# Patient Record
Sex: Male | Born: 1950
Health system: Southern US, Community
[De-identification: ages and names within clinical notes are randomized; demographics above are authoritative.]

## PROBLEM LIST (undated history)

## (undated) DIAGNOSIS — I219 Acute myocardial infarction, unspecified: Secondary | ICD-10-CM

## (undated) DIAGNOSIS — I493 Ventricular premature depolarization: Secondary | ICD-10-CM

## (undated) DIAGNOSIS — I951 Orthostatic hypotension: Secondary | ICD-10-CM

## (undated) DIAGNOSIS — Z9989 Dependence on other enabling machines and devices: Secondary | ICD-10-CM

## (undated) DIAGNOSIS — K219 Gastro-esophageal reflux disease without esophagitis: Secondary | ICD-10-CM

## (undated) DIAGNOSIS — I498 Other specified cardiac arrhythmias: Secondary | ICD-10-CM

## (undated) DIAGNOSIS — C189 Malignant neoplasm of colon, unspecified: Secondary | ICD-10-CM

## (undated) DIAGNOSIS — I10 Essential (primary) hypertension: Secondary | ICD-10-CM

## (undated) DIAGNOSIS — I2510 Atherosclerotic heart disease of native coronary artery without angina pectoris: Secondary | ICD-10-CM

## (undated) DIAGNOSIS — M549 Dorsalgia, unspecified: Secondary | ICD-10-CM

## (undated) DIAGNOSIS — I428 Other cardiomyopathies: Secondary | ICD-10-CM

## (undated) DIAGNOSIS — I5042 Chronic combined systolic (congestive) and diastolic (congestive) heart failure: Secondary | ICD-10-CM

## (undated) DIAGNOSIS — E785 Hyperlipidemia, unspecified: Secondary | ICD-10-CM

## (undated) DIAGNOSIS — G4733 Obstructive sleep apnea (adult) (pediatric): Secondary | ICD-10-CM

## (undated) DIAGNOSIS — I509 Heart failure, unspecified: Secondary | ICD-10-CM

## (undated) HISTORY — DX: Other specified cardiac arrhythmias: I49.8

## (undated) HISTORY — PX: COLON SURGERY: SHX602

## (undated) HISTORY — DX: Obstructive sleep apnea (adult) (pediatric): G47.33

## (undated) HISTORY — DX: Atherosclerotic heart disease of native coronary artery without angina pectoris: I25.10

## (undated) HISTORY — DX: Dependence on other enabling machines and devices: Z99.89

---

## 2016-12-19 ENCOUNTER — Encounter (HOSPITAL_BASED_OUTPATIENT_CLINIC_OR_DEPARTMENT_OTHER): Payer: Self-pay | Admitting: Emergency Medicine

## 2016-12-19 ENCOUNTER — Inpatient Hospital Stay (HOSPITAL_BASED_OUTPATIENT_CLINIC_OR_DEPARTMENT_OTHER)
Admission: EM | Admit: 2016-12-19 | Discharge: 2016-12-22 | DRG: 280 | Disposition: A | Discharge status: Home / Self Care | Payer: Medicare HMO | Attending: Cardiology | Admitting: Cardiology

## 2016-12-19 ENCOUNTER — Emergency Department (HOSPITAL_BASED_OUTPATIENT_CLINIC_OR_DEPARTMENT_OTHER): Payer: Medicare HMO

## 2016-12-19 DIAGNOSIS — I2510 Atherosclerotic heart disease of native coronary artery without angina pectoris: Secondary | ICD-10-CM

## 2016-12-19 DIAGNOSIS — I25110 Atherosclerotic heart disease of native coronary artery with unstable angina pectoris: Secondary | ICD-10-CM | POA: Diagnosis present

## 2016-12-19 DIAGNOSIS — Z85038 Personal history of other malignant neoplasm of large intestine: Secondary | ICD-10-CM

## 2016-12-19 DIAGNOSIS — I493 Ventricular premature depolarization: Secondary | ICD-10-CM

## 2016-12-19 DIAGNOSIS — E7800 Pure hypercholesterolemia, unspecified: Secondary | ICD-10-CM

## 2016-12-19 DIAGNOSIS — K30 Functional dyspepsia: Secondary | ICD-10-CM | POA: Diagnosis present

## 2016-12-19 DIAGNOSIS — I214 Non-ST elevation (NSTEMI) myocardial infarction: Secondary | ICD-10-CM | POA: Diagnosis not present

## 2016-12-19 DIAGNOSIS — Z79899 Other long term (current) drug therapy: Secondary | ICD-10-CM

## 2016-12-19 DIAGNOSIS — I5043 Acute on chronic combined systolic (congestive) and diastolic (congestive) heart failure: Secondary | ICD-10-CM

## 2016-12-19 DIAGNOSIS — I10 Essential (primary) hypertension: Secondary | ICD-10-CM

## 2016-12-19 DIAGNOSIS — I5042 Chronic combined systolic (congestive) and diastolic (congestive) heart failure: Secondary | ICD-10-CM

## 2016-12-19 DIAGNOSIS — R1013 Epigastric pain: Secondary | ICD-10-CM | POA: Diagnosis not present

## 2016-12-19 DIAGNOSIS — K210 Gastro-esophageal reflux disease with esophagitis: Secondary | ICD-10-CM | POA: Diagnosis present

## 2016-12-19 DIAGNOSIS — I110 Hypertensive heart disease with heart failure: Secondary | ICD-10-CM | POA: Diagnosis present

## 2016-12-19 HISTORY — DX: Gastro-esophageal reflux disease without esophagitis: K21.9

## 2016-12-19 HISTORY — DX: Essential (primary) hypertension: I10

## 2016-12-19 HISTORY — DX: Dorsalgia, unspecified: M54.9

## 2016-12-19 HISTORY — DX: Malignant neoplasm of colon, unspecified: C18.9

## 2016-12-19 HISTORY — DX: Hyperlipidemia, unspecified: E78.5

## 2016-12-19 LAB — CBC WITH DIFFERENTIAL/PLATELET
BASOS ABS: 0 10*3/uL (ref 0.0–0.1)
Basophils Relative: 1 %
EOS ABS: 0.2 10*3/uL (ref 0.0–0.7)
EOS PCT: 4 %
HCT: 46.7 % (ref 39.0–52.0)
Hemoglobin: 16.5 g/dL (ref 13.0–17.0)
Lymphocytes Relative: 24 %
Lymphs Abs: 1.4 10*3/uL (ref 0.7–4.0)
MCH: 31.5 pg (ref 26.0–34.0)
MCHC: 35.3 g/dL (ref 30.0–36.0)
MCV: 89.1 fL (ref 78.0–100.0)
Monocytes Absolute: 0.5 10*3/uL (ref 0.1–1.0)
Monocytes Relative: 9 %
Neutro Abs: 3.6 10*3/uL (ref 1.7–7.7)
Neutrophils Relative %: 62 %
PLATELETS: 185 10*3/uL (ref 150–400)
RBC: 5.24 MIL/uL (ref 4.22–5.81)
RDW: 12.5 % (ref 11.5–15.5)
WBC: 5.8 10*3/uL (ref 4.0–10.5)

## 2016-12-19 LAB — COMPREHENSIVE METABOLIC PANEL
ALT: 24 U/L (ref 17–63)
AST: 24 U/L (ref 15–41)
Albumin: 4.1 g/dL (ref 3.5–5.0)
Alkaline Phosphatase: 59 U/L (ref 38–126)
Anion gap: 7 (ref 5–15)
BUN: 12 mg/dL (ref 6–20)
CHLORIDE: 109 mmol/L (ref 101–111)
CO2: 24 mmol/L (ref 22–32)
CREATININE: 1.43 mg/dL — AB (ref 0.61–1.24)
Calcium: 9.5 mg/dL (ref 8.9–10.3)
GFR calc non Af Amer: 50 mL/min — ABNORMAL LOW (ref >60)
GFR, EST AFRICAN AMERICAN: 58 mL/min — AB (ref >60)
Glucose, Bld: 132 mg/dL — ABNORMAL HIGH (ref 65–99)
Potassium: 3.6 mmol/L (ref 3.5–5.1)
Sodium: 140 mmol/L (ref 135–145)
Total Bilirubin: 0.9 mg/dL (ref 0.3–1.2)
Total Protein: 7.5 g/dL (ref 6.5–8.1)

## 2016-12-19 LAB — BRAIN NATRIURETIC PEPTIDE: B Natriuretic Peptide: 445.3 pg/mL — ABNORMAL HIGH (ref 0.0–100.0)

## 2016-12-19 LAB — LIPASE, BLOOD: Lipase: 27 U/L (ref 11–51)

## 2016-12-19 LAB — TROPONIN I: TROPONIN I: 0.26 ng/mL — AB (ref <0.03)

## 2016-12-19 MED ORDER — ASPIRIN 81 MG PO CHEW
324.0000 mg | CHEWABLE_TABLET | Freq: Once | ORAL | Status: AC
  Administered 2016-12-19: 324 mg via ORAL
  Filled 2016-12-19: qty 4

## 2016-12-19 MED ORDER — METOPROLOL TARTRATE 5 MG/5ML IV SOLN: 5.0000 mg | Freq: Once | INTRAVENOUS | Status: DC

## 2016-12-19 MED ORDER — ONDANSETRON HCL 4 MG/2ML IJ SOLN
4.0000 mg | Freq: Once | INTRAMUSCULAR | Status: AC
  Administered 2016-12-19: 4 mg via INTRAVENOUS
  Filled 2016-12-19: qty 2

## 2016-12-19 MED ORDER — NITROGLYCERIN 0.4 MG SL SUBL
0.4000 mg | SUBLINGUAL_TABLET | SUBLINGUAL | Status: AC | PRN
  Administered 2016-12-20 (×3): 0.4 mg via SUBLINGUAL
  Filled 2016-12-19: qty 1

## 2016-12-19 MED ORDER — FENTANYL CITRATE (PF) 100 MCG/2ML IJ SOLN
50.0000 ug | Freq: Once | INTRAMUSCULAR | Status: AC
  Administered 2016-12-19: 50 ug via INTRAVENOUS
  Filled 2016-12-19: qty 2

## 2016-12-19 MED ORDER — PANTOPRAZOLE SODIUM 40 MG IV SOLR
40.0000 mg | Freq: Once | INTRAVENOUS | Status: AC
  Administered 2016-12-19: 40 mg via INTRAVENOUS
  Filled 2016-12-19: qty 40

## 2016-12-19 NOTE — ED Notes (Signed)
EDP Dr. Florina Ou into room, prior to RN assessment, see MD notes, orders received and initiated.

## 2016-12-19 NOTE — ED Provider Notes (Addendum)
[place] DEPT [facility] Provider Note: Georgena Spurling, MD, FACEP  CSN: [id number] MRN: [id number] ARRIVAL: 12/19/16 at 2227 ROOM: [place]  Abdominal Pain   HISTORY OF PRESENT ILLNESS  David Underwood is a 66 y.o. male with a remote history of colon cancer status post colostomy. He is here with a one to two-week history of epigastric pain. The pain worsened this evening and was associated with nausea, vomiting and clamminess. He rates his pain as a 9 out of 10 at the present time. It is worse with palpation of the epigastrium. He is also had shortness of breath over the past 2 weeks. The shortness of breath is worse with exertion or with lying supine. It is improved with rest or with standing. He has a history of esophagitis and is on Protonix.    Past Medical History:  Diagnosis Date  . Back pain   . Cancer ([place])   . GERD (gastroesophageal reflux disease)   . History of colostomy   . Hypertension     Past Surgical History:  Procedure Laterality Date  . COLON SURGERY      No family history on file.  Social History  Substance Use Topics  . Smoking status: Not on file  . Smokeless tobacco: Not on file  . Alcohol use Not on file    Prior to Admission medications   Medication Sig Start Date End Date Taking? Authorizing Provider  atorvastatin (LIPITOR) 10 MG tablet Take by mouth daily.   Yes [provider]  carvedilol (COREG) 12.5 MG tablet Take 12.5 mg by mouth 2 (two) times daily with a meal.   Yes [provider]  pantoprazole (PROTONIX) 20 MG tablet Take by mouth daily.   Yes [provider]    Allergies Aspirin   REVIEW OF SYSTEMS  Negative except as noted here or in the History of Present Illness.   PHYSICAL EXAMINATION  Initial Vital Signs Blood pressure (!) 180/130, pulse 64, temperature 98.9 F (37.2 C), temperature source Oral, resp. rate 14, SpO2 99 %.  Examination General: Well-developed,  well-nourished male in no acute distress; appearance consistent with age of record HENT: normocephalic; atraumatic Eyes: pupils equal, round and reactive to light; extraocular muscles intact Neck: supple Heart: regular rate and rhythm; no murmur; frequent PVCs Lungs: clear to auscultation bilaterally Chest: Lower sternal tenderness Abdomen: soft; nondistended; epigastric tenderness; no masses or hepatosplenomegaly; bowel sounds present; right lower quadrant colostomy Extremities: No deformity; full range of motion; pulses normal; +1 edema of the lower legs Neurologic: Awake, alert and oriented; motor function intact in all extremities and symmetric; no facial droop Skin: Warm and dry Psychiatric: Normal mood and affect   RESULTS  Summary of this visit's results, reviewed by myself:   EKG Interpretation  Date/Time:  Sunday Dec 19 2016 22:40:27 EDT Ventricular Rate:  66 PR Interval:  184 QRS Duration: 114 QT Interval:  432 QTC Calculation: 452 R Axis:   -177 Text Interpretation:  Sinus rhythm with occasional Premature ventricular complexes Possible Left atrial enlargement Septal infarct , age undetermined Lateral infarct , age undetermined Abnormal ECG No previous ECGs available Confirmed by Shanon Rosser [id number]) on 12/19/2016 10:43:43 PM       EKG Interpretation  Date/Time:  Monday Dec 20 2016 01:07:17 EDT Ventricular Rate:  52 PR Interval:  184 QRS Duration: 126 QT Interval:  518 QTC Calculation: 482 R Axis:   111 Text Interpretation:  Sinus rhythm Probable left atrial enlargement Nonspecific intraventricular  conduction delay Probable anterior infarct, age indeterminate Lateral leads are also involved PVCs seen previously Confirmed by Kearstin Learn [phone]) on 12/20/2016 1:24:27 AM       Laboratory Studies: Results for orders placed or performed during the hospital encounter of 12/19/16 (from the past 24 hour(s))  CBC with Differential/Platelet     Status: None   Collection  Time: 12/19/16 10:58 PM  Result Value Ref Range   WBC 5.8 4.0 - 10.5 K/uL   RBC 5.24 4.22 - 5.81 MIL/uL   Hemoglobin 16.5 13.0 - 17.0 g/dL   HCT 46.7 39.0 - 52.0 %   MCV 89.1 78.0 - 100.0 fL   MCH 31.5 26.0 - 34.0 pg   MCHC 35.3 30.0 - 36.0 g/dL   RDW 12.5 11.5 - 15.5 %   Platelets 185 150 - 400 K/uL   Neutrophils Relative % 62 %   Neutro Abs 3.6 1.7 - 7.7 K/uL   Lymphocytes Relative 24 %   Lymphs Abs 1.4 0.7 - 4.0 K/uL   Monocytes Relative 9 %   Monocytes Absolute 0.5 0.1 - 1.0 K/uL   Eosinophils Relative 4 %   Eosinophils Absolute 0.2 0.0 - 0.7 K/uL   Basophils Relative 1 %   Basophils Absolute 0.0 0.0 - 0.1 K/uL  Comprehensive metabolic panel     Status: Abnormal   Collection Time: 12/19/16 10:58 PM  Result Value Ref Range   Sodium 140 135 - 145 mmol/L   Potassium 3.6 3.5 - 5.1 mmol/L   Chloride 109 101 - 111 mmol/L   CO2 24 22 - 32 mmol/L   Glucose, Bld 132 (H) 65 - 99 mg/dL   BUN 12 6 - 20 mg/dL   Creatinine, Ser 1.43 (H) 0.61 - 1.24 mg/dL   Calcium 9.5 8.9 - 10.3 mg/dL   Total Protein 7.5 6.5 - 8.1 g/dL   Albumin 4.1 3.5 - 5.0 g/dL   AST 24 15 - 41 U/L   ALT 24 17 - 63 U/L   Alkaline Phosphatase 59 38 - 126 U/L   Total Bilirubin 0.9 0.3 - 1.2 mg/dL   GFR calc non Af Amer 50 (L) >60 mL/min   GFR calc Af Amer 58 (L) >60 mL/min   Anion gap 7 5 - 15  Lipase, blood     Status: None   Collection Time: 12/19/16 10:58 PM  Result Value Ref Range   Lipase 27 11 - 51 U/L  Troponin I     Status: Abnormal   Collection Time: 12/19/16 10:58 PM  Result Value Ref Range   Troponin I 0.26 (HH) <0.03 ng/mL  Brain natriuretic peptide     Status: Abnormal   Collection Time: 12/19/16 10:58 PM  Result Value Ref Range   B Natriuretic Peptide 445.3 (H) 0.0 - 100.0 pg/mL   Imaging Studies: Dg Chest 2 View  Result Date: 12/19/2016 CLINICAL DATA:  Acute onset of epigastric abdominal pain and pressure. Indigestion. Initial encounter. EXAM: CHEST  2 VIEW COMPARISON:  None. FINDINGS:  The lungs are well-aerated. Pulmonary vascularity is at the upper limits of normal. There is no evidence of focal opacification, pleural effusion or pneumothorax. The heart is borderline normal in size. No acute osseous abnormalities are seen. IMPRESSION: No acute cardiopulmonary process seen. Electronically Signed   By: Garald Balding M.D.   On: 12/19/2016 23:18    ED COURSE  Nursing notes and initial vitals signs, including pulse oximetry, reviewed.  Vitals:   12/20/16 0030 12/20/16 0036 12/20/16 0045  12/20/16 0100  BP: (!) 172/101 (!) 170/108 (!) 143/107 (!) 156/98  Pulse: 61 (!) 45 (!) 59 (!) 59  Resp: 13 14 12 18   Temp:      TempSrc:      SpO2: 93% 95% 95% 93%   12:46 AM Patient rates his pain a 4 out of 10 after 50 micrograms of fentanyl IV. He has had no significant change with sublingual nitroglycerin 3.  1:30 AM Dr. Fransico Him accepts for transfer to [facility] cardiology. She requests we start heparin.  PROCEDURES    ED DIAGNOSES     ICD-9-CM ICD-10-CM   1. Non-STEMI (non-ST elevated myocardial infarction) ([place]) 410.70 I21.4   2. Epigastric pain 789.06 R10.13   3. Hypertension not at goal 401.9 I10   4. PVCs (premature ventricular contractions) 427.69 I49.3        Zahli Vetsch, MD 12/20/16 0131    Shanon Rosser, MD 12/20/16 0133    Shanon Rosser, MD 12/20/16 4097

## 2016-12-19 NOTE — ED Notes (Signed)
PT to room 9 via wheelchair. Pt now states that his epigastric pain is stabbing and pressure. 2 RN's at bedside . Pt connected to cardiac monitor, in gown on stretcher.

## 2016-12-19 NOTE — ED Triage Notes (Signed)
PT presents to ED with complaints of epigastric pain and pressure and indigestion for about a week. Pt clammy in triage.

## 2016-12-19 NOTE — ED Notes (Signed)
Alert, NAD, calm, interactive, resps e/u, speaking in clear complete sentences, no dyspnea noted, skin W&D, VSS, BP elevated, (denies: sob, nausea, dizziness or visual changes). Family at [facility].

## 2016-12-19 NOTE — ED Notes (Signed)
Troponin 0.26 per lab Dr. Florina Ou and primary RN aware.

## 2016-12-20 ENCOUNTER — Inpatient Hospital Stay (HOSPITAL_COMMUNITY): Payer: Medicare HMO

## 2016-12-20 ENCOUNTER — Encounter (HOSPITAL_COMMUNITY): Payer: Self-pay | Admitting: *Deleted

## 2016-12-20 DIAGNOSIS — I2510 Atherosclerotic heart disease of native coronary artery without angina pectoris: Secondary | ICD-10-CM | POA: Diagnosis not present

## 2016-12-20 DIAGNOSIS — I10 Essential (primary) hypertension: Secondary | ICD-10-CM

## 2016-12-20 DIAGNOSIS — I110 Hypertensive heart disease with heart failure: Secondary | ICD-10-CM | POA: Diagnosis present

## 2016-12-20 DIAGNOSIS — K30 Functional dyspepsia: Secondary | ICD-10-CM | POA: Diagnosis present

## 2016-12-20 DIAGNOSIS — R1013 Epigastric pain: Secondary | ICD-10-CM | POA: Diagnosis present

## 2016-12-20 DIAGNOSIS — I5043 Acute on chronic combined systolic (congestive) and diastolic (congestive) heart failure: Secondary | ICD-10-CM | POA: Diagnosis present

## 2016-12-20 DIAGNOSIS — E7800 Pure hypercholesterolemia, unspecified: Secondary | ICD-10-CM

## 2016-12-20 DIAGNOSIS — I214 Non-ST elevation (NSTEMI) myocardial infarction: Secondary | ICD-10-CM | POA: Diagnosis present

## 2016-12-20 DIAGNOSIS — I360 Nonrheumatic tricuspid (valve) stenosis: Secondary | ICD-10-CM

## 2016-12-20 DIAGNOSIS — I340 Nonrheumatic mitral (valve) insufficiency: Secondary | ICD-10-CM

## 2016-12-20 DIAGNOSIS — K210 Gastro-esophageal reflux disease with esophagitis: Secondary | ICD-10-CM | POA: Diagnosis present

## 2016-12-20 DIAGNOSIS — I25110 Atherosclerotic heart disease of native coronary artery with unstable angina pectoris: Secondary | ICD-10-CM | POA: Diagnosis present

## 2016-12-20 DIAGNOSIS — Z85038 Personal history of other malignant neoplasm of large intestine: Secondary | ICD-10-CM | POA: Diagnosis not present

## 2016-12-20 DIAGNOSIS — Z79899 Other long term (current) drug therapy: Secondary | ICD-10-CM | POA: Diagnosis not present

## 2016-12-20 DIAGNOSIS — I5023 Acute on chronic systolic (congestive) heart failure: Secondary | ICD-10-CM | POA: Diagnosis not present

## 2016-12-20 LAB — ECHOCARDIOGRAM COMPLETE
Height: 71 in
WEIGHTICAEL: 3456 [oz_av]

## 2016-12-20 LAB — LIPID PANEL
CHOLESTEROL: 168 mg/dL (ref 0–200)
HDL: 48 mg/dL (ref >40)
LDL Cholesterol: 109 mg/dL — ABNORMAL HIGH (ref 0–99)
Total CHOL/HDL Ratio: 3.5 RATIO
Triglycerides: 55 mg/dL (ref <150)
VLDL: 11 mg/dL (ref 0–40)

## 2016-12-20 LAB — COMPREHENSIVE METABOLIC PANEL
ALK PHOS: 50 U/L (ref 38–126)
ALT: 20 U/L (ref 17–63)
ANION GAP: 7 (ref 5–15)
AST: 20 U/L (ref 15–41)
Albumin: 3.4 g/dL — ABNORMAL LOW (ref 3.5–5.0)
BILIRUBIN TOTAL: 1.1 mg/dL (ref 0.3–1.2)
BUN: 10 mg/dL (ref 6–20)
CALCIUM: 9 mg/dL (ref 8.9–10.3)
CO2: 24 mmol/L (ref 22–32)
Chloride: 107 mmol/L (ref 101–111)
Creatinine, Ser: 1.39 mg/dL — ABNORMAL HIGH (ref 0.61–1.24)
GFR, EST AFRICAN AMERICAN: 60 mL/min — AB (ref >60)
GFR, EST NON AFRICAN AMERICAN: 52 mL/min — AB (ref >60)
Glucose, Bld: 116 mg/dL — ABNORMAL HIGH (ref 65–99)
Potassium: 3.6 mmol/L (ref 3.5–5.1)
Sodium: 138 mmol/L (ref 135–145)
TOTAL PROTEIN: 6.2 g/dL — AB (ref 6.5–8.1)

## 2016-12-20 LAB — CBC WITH DIFFERENTIAL/PLATELET
Basophils Absolute: 0 10*3/uL (ref 0.0–0.1)
Basophils Relative: 0 %
EOS ABS: 0.2 10*3/uL (ref 0.0–0.7)
Eosinophils Relative: 3 %
HCT: 41.7 % (ref 39.0–52.0)
HEMOGLOBIN: 14 g/dL (ref 13.0–17.0)
LYMPHS ABS: 1.9 10*3/uL (ref 0.7–4.0)
Lymphocytes Relative: 28 %
MCH: 30.5 pg (ref 26.0–34.0)
MCHC: 33.6 g/dL (ref 30.0–36.0)
MCV: 90.8 fL (ref 78.0–100.0)
MONOS PCT: 8 %
Monocytes Absolute: 0.5 10*3/uL (ref 0.1–1.0)
NEUTROS PCT: 61 %
Neutro Abs: 4.2 10*3/uL (ref 1.7–7.7)
Platelets: 178 10*3/uL (ref 150–400)
RBC: 4.59 MIL/uL (ref 4.22–5.81)
RDW: 12.9 % (ref 11.5–15.5)
WBC: 6.8 10*3/uL (ref 4.0–10.5)

## 2016-12-20 LAB — HEPARIN LEVEL (UNFRACTIONATED)
HEPARIN UNFRACTIONATED: 0.47 [IU]/mL (ref 0.30–0.70)
Heparin Unfractionated: 0.43 IU/mL (ref 0.30–0.70)

## 2016-12-20 LAB — PROTIME-INR
INR: 1.28
Prothrombin Time: 16.1 seconds — ABNORMAL HIGH (ref 11.4–15.2)

## 2016-12-20 LAB — TSH: TSH: 1.197 u[IU]/mL (ref 0.350–4.500)

## 2016-12-20 LAB — MAGNESIUM: MAGNESIUM: 2 mg/dL (ref 1.7–2.4)

## 2016-12-20 LAB — TROPONIN I
TROPONIN I: 0.26 ng/mL — AB (ref <0.03)
Troponin I: 0.27 ng/mL (ref <0.03)
Troponin I: 0.3 ng/mL (ref <0.03)
Troponin I: 0.37 ng/mL (ref <0.03)

## 2016-12-20 MED ORDER — ONDANSETRON HCL 4 MG/2ML IJ SOLN
4.0000 mg | Freq: Four times a day (QID) | INTRAMUSCULAR | Status: DC | PRN
  Administered 2016-12-20: 4 mg via INTRAVENOUS
  Filled 2016-12-20: qty 2

## 2016-12-20 MED ORDER — SACUBITRIL-VALSARTAN 24-26 MG PO TABS
1.0000 | ORAL_TABLET | Freq: Two times a day (BID) | ORAL | Status: DC
  Administered 2016-12-20 – 2016-12-22 (×5): 1 via ORAL
  Filled 2016-12-20 (×5): qty 1

## 2016-12-20 MED ORDER — AMLODIPINE BESYLATE 5 MG PO TABS
5.0000 mg | ORAL_TABLET | Freq: Every day | ORAL | Status: DC
  Administered 2016-12-20: 5 mg via ORAL
  Filled 2016-12-20: qty 1

## 2016-12-20 MED ORDER — FUROSEMIDE 10 MG/ML IJ SOLN
40.0000 mg | Freq: Once | INTRAMUSCULAR | Status: AC
  Administered 2016-12-20: 40 mg via INTRAVENOUS
  Filled 2016-12-20: qty 4

## 2016-12-20 MED ORDER — NITROGLYCERIN IN D5W 200-5 MCG/ML-% IV SOLN
INTRAVENOUS | Status: AC
  Filled 2016-12-20: qty 250

## 2016-12-20 MED ORDER — PANTOPRAZOLE SODIUM 40 MG PO TBEC
40.0000 mg | DELAYED_RELEASE_TABLET | Freq: Every day | ORAL | Status: DC
  Administered 2016-12-20 – 2016-12-22 (×3): 40 mg via ORAL
  Filled 2016-12-20 (×3): qty 1

## 2016-12-20 MED ORDER — NITROGLYCERIN IN D5W 200-5 MCG/ML-% IV SOLN
0.0000 ug/min | INTRAVENOUS | Status: DC
  Administered 2016-12-20: 5 ug/min via INTRAVENOUS

## 2016-12-20 MED ORDER — ASPIRIN EC 81 MG PO TBEC
81.0000 mg | DELAYED_RELEASE_TABLET | Freq: Every day | ORAL | Status: DC
  Administered 2016-12-22: 81 mg via ORAL
  Filled 2016-12-20: qty 1

## 2016-12-20 MED ORDER — POTASSIUM CHLORIDE CRYS ER 20 MEQ PO TBCR
20.0000 meq | EXTENDED_RELEASE_TABLET | Freq: Every day | ORAL | Status: DC
  Administered 2016-12-20 – 2016-12-22 (×3): 20 meq via ORAL
  Filled 2016-12-20 (×3): qty 1

## 2016-12-20 MED ORDER — ROSUVASTATIN CALCIUM 10 MG PO TABS
20.0000 mg | ORAL_TABLET | Freq: Every day | ORAL | Status: DC
  Administered 2016-12-20 – 2016-12-21 (×2): 20 mg via ORAL
  Filled 2016-12-20 (×2): qty 2

## 2016-12-20 MED ORDER — CARVEDILOL 12.5 MG PO TABS
12.5000 mg | ORAL_TABLET | Freq: Two times a day (BID) | ORAL | Status: DC
  Administered 2016-12-20: 12.5 mg via ORAL
  Filled 2016-12-20: qty 1

## 2016-12-20 MED ORDER — ALUM & MAG HYDROXIDE-SIMETH 200-200-20 MG/5ML PO SUSP
30.0000 mL | Freq: Four times a day (QID) | ORAL | Status: DC | PRN
  Administered 2016-12-20: 30 mL via ORAL
  Filled 2016-12-20: qty 30

## 2016-12-20 MED ORDER — ACETAMINOPHEN 325 MG PO TABS: 650.0000 mg | ORAL_TABLET | ORAL | Status: DC | PRN

## 2016-12-20 MED ORDER — ASPIRIN 300 MG RE SUPP: 300.0000 mg | RECTAL | Status: DC

## 2016-12-20 MED ORDER — ASPIRIN 81 MG PO CHEW
81.0000 mg | CHEWABLE_TABLET | ORAL | Status: AC
  Administered 2016-12-21: 81 mg via ORAL
  Filled 2016-12-20: qty 1

## 2016-12-20 MED ORDER — SODIUM CHLORIDE 0.9 % IV SOLN
INTRAVENOUS | Status: DC
  Administered 2016-12-21: 06:00:00 via INTRAVENOUS

## 2016-12-20 MED ORDER — NITROGLYCERIN 2 % TD OINT
1.0000 [in_us] | TOPICAL_OINTMENT | Freq: Once | TRANSDERMAL | Status: AC
  Administered 2016-12-20: 1 [in_us] via TOPICAL
  Filled 2016-12-20: qty 1

## 2016-12-20 MED ORDER — HEPARIN BOLUS VIA INFUSION
4000.0000 [IU] | Freq: Once | INTRAVENOUS | Status: AC
  Administered 2016-12-20: 4000 [IU] via INTRAVENOUS

## 2016-12-20 MED ORDER — SIMETHICONE 80 MG PO CHEW
80.0000 mg | CHEWABLE_TABLET | Freq: Every day | ORAL | Status: DC | PRN
  Filled 2016-12-20: qty 1

## 2016-12-20 MED ORDER — CARVEDILOL 6.25 MG PO TABS
6.2500 mg | ORAL_TABLET | Freq: Two times a day (BID) | ORAL | Status: DC
  Administered 2016-12-20 – 2016-12-22 (×4): 6.25 mg via ORAL
  Filled 2016-12-20 (×4): qty 1

## 2016-12-20 MED ORDER — FUROSEMIDE 40 MG PO TABS
40.0000 mg | ORAL_TABLET | Freq: Every day | ORAL | Status: DC
  Administered 2016-12-21: 40 mg via ORAL
  Filled 2016-12-20: qty 1

## 2016-12-20 MED ORDER — ACETAMINOPHEN 325 MG PO TABS
650.0000 mg | ORAL_TABLET | Freq: Once | ORAL | Status: AC
  Administered 2016-12-20: 650 mg via ORAL
  Filled 2016-12-20: qty 2

## 2016-12-20 MED ORDER — NITROGLYCERIN 0.4 MG SL SUBL
0.4000 mg | SUBLINGUAL_TABLET | SUBLINGUAL | Status: DC | PRN
  Filled 2016-12-20: qty 1

## 2016-12-20 MED ORDER — ATORVASTATIN CALCIUM 80 MG PO TABS: 80.0000 mg | ORAL_TABLET | Freq: Every day | ORAL | Status: DC

## 2016-12-20 MED ORDER — ASPIRIN 81 MG PO CHEW: 324.0000 mg | CHEWABLE_TABLET | ORAL | Status: DC

## 2016-12-20 MED ORDER — HEPARIN (PORCINE) IN NACL 100-0.45 UNIT/ML-% IJ SOLN
1000.0000 [IU]/h | INTRAMUSCULAR | Status: DC
  Administered 2016-12-20 (×2): 1000 [IU]/h via INTRAVENOUS
  Filled 2016-12-20 (×2): qty 250

## 2016-12-20 NOTE — Progress Notes (Addendum)
     Pt was seen by Dr. Radford Pax last night / this AM  I have reviewed echo which shows severe LV systolic dysfunction. He eats a very high salt diet ( bologna, hot dogs, etc many times a week) Eats fast food frequently   He will need a cath tomorrow to evaluate him for CAD  I have discussed the risks, benefits, options. He understands and agrees to proceed.   1. Acute on chronic systolic CHF Will DC amlodipine.  Start Entresto 24/26 mg PO BID Continue coreg.  Give lasix 40 IV mg today and then 40 mg PO QD .   , Kdur 20 meq today   Have added to the cath board for tomorrow   2.  HTN:   Have DC'd amlodipine, Start enresto Continue coreg   Addendum    3:06 PM  Pt started having more abdominal pain. Again relieved with SL NTG. Have started IV NTG. Will continue to draw troponins. Cath tomorrow  Would consider cath tonight if he has continued abdominal pain if the troponins increase significantly .    Mertie Moores, MD  12/20/2016 3:07 PM    [facility] [place],  [address], [place]  [id number] Pager [phone] Phone: [phone]; Fax: [phone]

## 2016-12-20 NOTE — ED Notes (Signed)
Up to b/r, steady gait, alert, NAD, calm, no changes.

## 2016-12-20 NOTE — Progress Notes (Signed)
  Echocardiogram 2D Echocardiogram has been performed.  David Underwood 12/20/2016, 10:56 AM

## 2016-12-20 NOTE — ED Notes (Signed)
EDP into see, updated with results and plan

## 2016-12-20 NOTE — Progress Notes (Signed)
ANTICOAGULATION CONSULT NOTE - [place] for heparin  Indication: chest pain/ACS  Allergies  Allergen Reactions  . Aspirin     "stomach irritation", GI bleeding, also mentions colon CA    Patient Measurements: Height: 5\' 11"  (180.3 cm) Weight: 216 lb (98 kg) IBW/kg (Calculated) : 75.3 Heparin Dosing Weight: 95  Vital Signs: Temp: 97.5 F (36.4 C) (05/28 0327) Temp Source: Oral (05/28 0327) BP: 158/94 (05/28 0327) Pulse Rate: 55 (05/28 0327)  Labs:  Recent Labs  12/19/16 2258 12/20/16 0454 12/20/16 0958 12/20/16 1228  HGB 16.5 14.0  --   --   HCT 46.7 41.7  --   --   PLT 185 178  --   --   LABPROT  --  16.1*  --   --   INR  --  1.28  --   --   HEPARINUNFRC  --   --   --  0.47  CREATININE 1.43* 1.39*  --   --   TROPONINI 0.26* 0.26* 0.27*  --     Estimated Creatinine Clearance: 63.2 mL/min (A) (by C-G formula based on SCr of 1.39 mg/dL (H)).   Medical History: Past Medical History:  Diagnosis Date  . Back pain   . Colon cancer [facility])    s/p colostomy  . GERD (gastroesophageal reflux disease)    with associated esophagitis on PPI  . Hyperlipidemia   . Hypertension     Assessment: 51 yoM transferred from [facility] on heparin infusion for ACS/NSTEMI. Initial heparin level therapeutic at 0.47, CBC wnl, plan for cardiac cath in the morning.  Goal of Therapy:  Heparin level 0.3-0.7 units/ml Monitor platelets by anticoagulation protocol: Yes   Plan:  -Continue heparin infusion at current rate of 1000 units/hr (25ml/hr) -Confirmatory heparin level in 6-hr -Monitor heparin level, CBC, S/Sx bleeding daily -F/U plans post-cath  Arrie Senate, PharmD PGY-1 Pharmacy Resident Pager: [phone] 12/20/2016

## 2016-12-20 NOTE — ED Notes (Signed)
EDP into room, pt updated with results and plan.

## 2016-12-20 NOTE — Progress Notes (Signed)
ANTICOAGULATION CONSULT NOTE - Initial Consult  Pharmacy Consult for heparin  Indication: chest pain/ACS  Allergies  Allergen Reactions  . Aspirin     "stomach irritation", GI bleeding, also mentions colon CA    Patient Measurements: Height: 5\' 11"  (180.3 cm) Weight: 216 lb (98 kg) IBW/kg (Calculated) : 75.3 Heparin Dosing Weight: 95  Vital Signs: Temp: 97.5 F (36.4 C) (05/28 0327) Temp Source: Oral (05/28 0327) BP: 158/94 (05/28 0327) Pulse Rate: 55 (05/28 0327)  Labs:  Recent Labs  12/19/16 2258  HGB 16.5  HCT 46.7  PLT 185  CREATININE 1.43*  TROPONINI 0.26*    Estimated Creatinine Clearance: 61.5 mL/min (A) (by C-G formula based on SCr of 1.43 mg/dL (H)).   Medical History: Past Medical History:  Diagnosis Date  . Back pain   . Cancer ([place])   . GERD (gastroesophageal reflux disease)   . History of colostomy   . Hypertension     Assessment: 66 yo male transferred from [facility] on heparin infusion for ACS/NSTEMI. Received 4000 unit bolus and heparin infusion currently running at 1000 units/hr. CBC stable.   Goal of Therapy:  Heparin level 0.3-0.7 units/ml Monitor platelets by anticoagulation protocol: Yes   Plan:  1. Continue heparin infusion at current rate of 1000 units/hr (42ml/hr) 2. Obtain heparin level at 0900  Vincenza Hews, PharmD, BCPS 12/20/2016, 3:36 AM

## 2016-12-20 NOTE — ED Notes (Signed)
No changes, [facility].

## 2016-12-20 NOTE — Progress Notes (Signed)
Reassessed patient - Nitro and Heparin gtt infusing without issue. Pt currently pain free, nausea free. Will continue to monitor closely.

## 2016-12-20 NOTE — Progress Notes (Signed)
Called to patient's room by pharmacy tech stating patient doubling over with abdominal pain. Upon arrival to room, pt sitting on side of bed c/o 10/10 epigastric pain, nausea and visibly diaphoretic. BP elevated 140s/90s. Pt assisted to lying back in bed - 12 lead EKG obtained, O2 applied @ 4L [place] and 1 SL nitro given along with PRN zofran. Pt's pain down to 6/10 after SL nitro - nausea resolved. Spoke with Dr. Acie Fredrickson who asked to start patient on a nitro gtt and repeat troponin's this evening and tonight. He personally reviewed 12 lead EKG - no apparent changes. Pt currently scheduled for cath tomorrow. IV heparin and IV nitro infusing as ordered. Pt more comfortable, currently at a 2/10 but states "its starting to lower more so its almost a 1/10." Will continue to monitor patient closely. Pt's wife at bedside throughout - understands plan of care.

## 2016-12-20 NOTE — H&P (Addendum)
Admit date: 12/19/2016 Referring Physician: Dr. Florina Ou Primary Cardiologist: None Chief complaint/reason for admission:None  HPI: David Underwood is a 66 y.o. male who is being seen today for the evaluation of chest pain at the request of Dr. Florina Ou at [facility].  He has a history of GERD, colon CA s/p colostomy and HTN and presented to [facility] with 2 week history of epigastric pain.  He has a history of GERD/esophagitis and thought it was related to that but the pain got worse last night at 9/10 and was associated with N/V and he became diaphoretic. He admits to having problems with DOE as well as orthopnea.   In ER pain was worse with palpation of the epigastrium but troponin noted to be elevated at 0.26 and BNP elevated at 445.  CXRAY showed NAD.  EKG showed sinus bradycardia with nonspecific IVCD and diffuse T wave abnormality c/w inferolateral ischemia and anterior infarct.  He was transferred to [facility] for further evaluation.  He says that currently he denies any epigastric discomfort after getting 3SL NTG.   PMH:    Past Medical History:  Diagnosis Date  . Back pain   . Colon cancer [facility])    s/p colostomy  . GERD (gastroesophageal reflux disease)    with associated esophagitis on PPI  . Hyperlipidemia   . Hypertension     PSH:    Past Surgical History:  Procedure Laterality Date  . COLON SURGERY      ALLERGIES:   Aspirin  Prior to Admit Meds:   Prescriptions Prior to Admission  Medication Sig Dispense Refill Last Dose  . atorvastatin (LIPITOR) 10 MG tablet Take by mouth daily.     . carvedilol (COREG) 12.5 MG tablet Take 12.5 mg by mouth 2 (two) times daily with a meal.     . pantoprazole (PROTONIX) 20 MG tablet Take by mouth daily.      Family HX:    Family History  Problem Relation Age of Onset  . Colon cancer Mother   . Deep vein thrombosis Father   . Heart disease Brother   . Heart attack Brother    Social HX:    Social History   Social History  .  Marital status: Married    Spouse name: N/A  . Number of children: N/A  . Years of education: N/A   Occupational History  . Not on file.   Social History Main Topics  . Smoking status: Never Smoker  . Smokeless tobacco: Never Used  . Alcohol use Not on file  . Drug use: Unknown  . Sexual activity: No   Other Topics Concern  . Not on file   Social History Narrative  . No narrative on file     ROS:  All ROS were addressed and are negative except what is stated in the HPI  PHYSICAL EXAM Vitals:   12/20/16 0230 12/20/16 0327  BP: (!) 153/93 (!) 158/94  Pulse: 60 (!) 55  Resp: 15 18  Temp:  97.5 F (36.4 C)   General: Well developed, well nourished, in no acute distress Head: Eyes PERRLA, No xanthomas.   Normal cephalic and atramatic  Lungs:   Clear bilaterally to auscultation and percussion. Heart:   HRRR S1 S2 Pulses are 2+ & equal.            No carotid bruit. No JVD.  No abdominal bruits. No femoral bruits. Abdomen: Bowel sounds are positive, abdomen soft and non-tender without masses  or                  Hernia's noted. Msk:  Back normal, normal gait. Normal strength and tone for age. Extremities:   No clubbing, cyanosis or edema.  DP +1 Neuro: Alert and oriented X 3. Psych:  Good affect, responds appropriately   Labs:   Lab Results  Component Value Date   WBC 5.8 12/19/2016   HGB 16.5 12/19/2016   HCT 46.7 12/19/2016   MCV 89.1 12/19/2016   PLT 185 12/19/2016     Recent Labs Lab 12/19/16 2258  NA 140  K 3.6  CL 109  CO2 24  BUN 12  CREATININE 1.43*  CALCIUM 9.5  PROT 7.5  BILITOT 0.9  ALKPHOS 59  ALT 24  AST 24  GLUCOSE 132*   Lab Results  Component Value Date   TROPONINI 0.26 ([place]) 12/19/2016   No results found for: PTT No results found for: INR, PROTIME  No results found for: CHOL No results found for: HDL No results found for: LDLCALC No results found for: TRIG No results found for: CHOLHDL No results found for: LDLDIRECT      Radiology:  Dg Chest 2 View  Result Date: 12/19/2016 CLINICAL DATA:  Acute onset of epigastric abdominal pain and pressure. Indigestion. Initial encounter. EXAM: CHEST  2 VIEW COMPARISON:  None. FINDINGS: The lungs are well-aerated. Pulmonary vascularity is at the upper limits of normal. There is no evidence of focal opacification, pleural effusion or pneumothorax. The heart is borderline normal in size. No acute osseous abnormalities are seen. IMPRESSION: No acute cardiopulmonary process seen. Electronically Signed   By: Garald Balding M.D.   On: 12/19/2016 23:18     Telemetry    NSR with PVCs - Personally Reviewed  ECG    NSR with anterior infarct, nonspecific IVCD and diffuse inferolateral T wave abnormality - Personally Reviewed   ASSESSMENT/PLAN:   1.  Chest/epigastric pain/NSTEMI - somewhat atypical in that it was initially worse with palpation of epigastric region but associated with N/V and diaphoresis and he has had DOE for several weeks.  Initial trop elevated at 0.26.  EKG very worrisome for ischemia with inferolateral T wave abnormality - There is no old EKG for comparison.   - Admit to tele bed - cycle troponin - IV Heparin started at [facility] and will continue per pharmacy - ASA 81mg  daily - 2D echo in am to evaluate LVF  - further ischemic workup pending trop trend and 2D echo results - continue coreg 12.5mg  BID  - Increase Lipitor to 80mg  daily  2.  GERD with history of reflux esophagitis - continue PPI  3.  HTN - BP poorly controlled on current meds.   - Continue Carvedilol - cannot titrate further due to bradycardia - Creatinine 1.43 so will avoid ACE I /ARB. - start amlodipine 5mg  daily.  4.  Hyperlipidemia  - increase Lipitor to 80mg  daily - check FLP in am  Fransico Him, MD  12/20/2016  4:14 AM

## 2016-12-20 NOTE — Progress Notes (Signed)
ANTICOAGULATION CONSULT NOTE - Follow Up Consult  Pharmacy Consult for Heparin Indication: chest pain/ACS  Allergies  Allergen Reactions  . Aspirin     "stomach irritation", GI bleeding, also mentions colon CA    Patient Measurements: Height: 5\' 11"  (180.3 cm) Weight: 216 lb (98 kg) IBW/kg (Calculated) : 75.3 Heparin Dosing Weight: 95kg  Vital Signs: Temp: 98.4 F (36.9 C) (05/28 1356) Temp Source: Oral (05/28 1356) BP: 150/95 (05/28 1835) Pulse Rate: 61 (05/28 1835)  Labs:  Recent Labs  12/19/16 2258 12/20/16 0454 12/20/16 0958 12/20/16 1228 12/20/16 1809  HGB 16.5 14.0  --   --   --   HCT 46.7 41.7  --   --   --   PLT 185 178  --   --   --   LABPROT  --  16.1*  --   --   --   INR  --  1.28  --   --   --   HEPARINUNFRC  --   --   --  0.47 0.43  CREATININE 1.43* 1.39*  --   --   --   TROPONINI 0.26* 0.26* 0.27*  --  0.30*    Estimated Creatinine Clearance: 63.2 mL/min (A) (by C-G formula based on SCr of 1.39 mg/dL (H)).   Medications:  Heparin @ 1000 units/hr  Assessment: 66yom continues on heparin for NSTEMI with plan for cath tomorrow. Confirmatory heparin level is therapeutic at 0.43. No bleeding.   Goal of Therapy:  Heparin level 0.3-0.7 units/ml Monitor platelets by anticoagulation protocol: Yes   Plan:  1) Continue heparin at 1000 units/hr 2) Daily heparin level and CBC  Deboraha Sprang 12/20/2016,7:18 PM

## 2016-12-20 NOTE — Progress Notes (Signed)
Pt c/o epigastric pain - says its a burning type pain. Maalox ordered per cardiology PRNs. Pt states the pain worsens after he eats majority of the time. Dr. Acie Fredrickson on floor and to see patient - made aware, no new orders received as of yet.  Will continue to monitor closely.

## 2016-12-21 ENCOUNTER — Encounter (HOSPITAL_COMMUNITY): Payer: Self-pay | Admitting: Cardiology

## 2016-12-21 ENCOUNTER — Encounter (HOSPITAL_COMMUNITY)
Admission: EM | Disposition: A | Discharge status: Home / Self Care | Payer: Self-pay | Source: Home / Self Care | Attending: Cardiology

## 2016-12-21 DIAGNOSIS — I5023 Acute on chronic systolic (congestive) heart failure: Secondary | ICD-10-CM

## 2016-12-21 DIAGNOSIS — I2510 Atherosclerotic heart disease of native coronary artery without angina pectoris: Secondary | ICD-10-CM

## 2016-12-21 HISTORY — PX: LEFT HEART CATH AND CORONARY ANGIOGRAPHY: CATH118249

## 2016-12-21 LAB — BASIC METABOLIC PANEL
ANION GAP: 10 (ref 5–15)
BUN: 11 mg/dL (ref 6–20)
CO2: 23 mmol/L (ref 22–32)
Calcium: 9.2 mg/dL (ref 8.9–10.3)
Chloride: 103 mmol/L (ref 101–111)
Creatinine, Ser: 1.42 mg/dL — ABNORMAL HIGH (ref 0.61–1.24)
GFR calc Af Amer: 58 mL/min — ABNORMAL LOW (ref >60)
GFR calc non Af Amer: 50 mL/min — ABNORMAL LOW (ref >60)
Glucose, Bld: 98 mg/dL (ref 65–99)
Potassium: 3.5 mmol/L (ref 3.5–5.1)
Sodium: 136 mmol/L (ref 135–145)

## 2016-12-21 LAB — CBC
HEMATOCRIT: 45.8 % (ref 39.0–52.0)
HEMATOCRIT: 45.7 % (ref 39.0–52.0)
HEMOGLOBIN: 15.9 g/dL (ref 13.0–17.0)
HEMOGLOBIN: 15.4 g/dL (ref 13.0–17.0)
MCH: 31.5 pg (ref 26.0–34.0)
MCH: 30.8 pg (ref 26.0–34.0)
MCHC: 34.7 g/dL (ref 30.0–36.0)
MCHC: 33.7 g/dL (ref 30.0–36.0)
MCV: 90.9 fL (ref 78.0–100.0)
MCV: 91.4 fL (ref 78.0–100.0)
Platelets: 179 10*3/uL (ref 150–400)
Platelets: 182 10*3/uL (ref 150–400)
RBC: 5.04 MIL/uL (ref 4.22–5.81)
RBC: 5 MIL/uL (ref 4.22–5.81)
RDW: 12.7 % (ref 11.5–15.5)
RDW: 12.7 % (ref 11.5–15.5)
WBC: 10.3 10*3/uL (ref 4.0–10.5)
WBC: 8.2 10*3/uL (ref 4.0–10.5)

## 2016-12-21 LAB — HEMOGLOBIN A1C
HEMOGLOBIN A1C: 5.2 % (ref 4.8–5.6)
MEAN PLASMA GLUCOSE: 103 mg/dL

## 2016-12-21 LAB — HEPARIN LEVEL (UNFRACTIONATED): HEPARIN UNFRACTIONATED: 0.38 [IU]/mL (ref 0.30–0.70)

## 2016-12-21 LAB — CREATININE, SERUM
Creatinine, Ser: 1.5 mg/dL — ABNORMAL HIGH (ref 0.61–1.24)
GFR calc Af Amer: 55 mL/min — ABNORMAL LOW (ref >60)
GFR, EST NON AFRICAN AMERICAN: 47 mL/min — AB (ref >60)

## 2016-12-21 SURGERY — LEFT HEART CATH AND CORONARY ANGIOGRAPHY
Anesthesia: LOCAL

## 2016-12-21 MED ORDER — FENTANYL CITRATE (PF) 100 MCG/2ML IJ SOLN
INTRAMUSCULAR | Status: AC
  Filled 2016-12-21: qty 2

## 2016-12-21 MED ORDER — ENOXAPARIN SODIUM 40 MG/0.4ML ~~LOC~~ SOLN: 40.0000 mg | SUBCUTANEOUS | Status: DC

## 2016-12-21 MED ORDER — MIDAZOLAM HCL 2 MG/2ML IJ SOLN
INTRAMUSCULAR | Status: DC | PRN
  Administered 2016-12-21: 1 mg via INTRAVENOUS

## 2016-12-21 MED ORDER — SODIUM CHLORIDE 0.9 % IV SOLN: 250.0000 mL | INTRAVENOUS | Status: DC | PRN

## 2016-12-21 MED ORDER — SODIUM CHLORIDE 0.9% FLUSH: 3.0000 mL | INTRAVENOUS | Status: DC | PRN

## 2016-12-21 MED ORDER — LIDOCAINE HCL (PF) 1 % IJ SOLN
INTRAMUSCULAR | Status: AC
  Filled 2016-12-21: qty 30

## 2016-12-21 MED ORDER — HEPARIN (PORCINE) IN NACL 2-0.9 UNIT/ML-% IJ SOLN
INTRAMUSCULAR | Status: AC
  Filled 2016-12-21: qty 1000

## 2016-12-21 MED ORDER — SODIUM CHLORIDE 0.9% FLUSH
3.0000 mL | Freq: Two times a day (BID) | INTRAVENOUS | Status: DC
  Administered 2016-12-21 (×2): 3 mL via INTRAVENOUS

## 2016-12-21 MED ORDER — VERAPAMIL HCL 2.5 MG/ML IV SOLN
INTRAVENOUS | Status: AC
  Filled 2016-12-21: qty 2

## 2016-12-21 MED ORDER — HEPARIN (PORCINE) IN NACL 2-0.9 UNIT/ML-% IJ SOLN
INTRAMUSCULAR | Status: AC | PRN
  Administered 2016-12-21: 1000 mL

## 2016-12-21 MED ORDER — LIDOCAINE HCL (PF) 1 % IJ SOLN
INTRAMUSCULAR | Status: DC | PRN
  Administered 2016-12-21: 2 mL

## 2016-12-21 MED ORDER — HEPARIN SODIUM (PORCINE) 1000 UNIT/ML IJ SOLN
INTRAMUSCULAR | Status: AC
  Filled 2016-12-21: qty 1

## 2016-12-21 MED ORDER — FENTANYL CITRATE (PF) 100 MCG/2ML IJ SOLN
INTRAMUSCULAR | Status: DC | PRN
  Administered 2016-12-21: 25 ug via INTRAVENOUS

## 2016-12-21 MED ORDER — IOPAMIDOL (ISOVUE-370) INJECTION 76%
INTRAVENOUS | Status: DC | PRN
  Administered 2016-12-21: 85 mL via INTRA_ARTERIAL

## 2016-12-21 MED ORDER — IOPAMIDOL (ISOVUE-370) INJECTION 76%
INTRAVENOUS | Status: AC
  Filled 2016-12-21: qty 100

## 2016-12-21 MED ORDER — HEPARIN SODIUM (PORCINE) 1000 UNIT/ML IJ SOLN
INTRAMUSCULAR | Status: DC | PRN
  Administered 2016-12-21: 5000 [IU] via INTRAVENOUS

## 2016-12-21 MED ORDER — HEPARIN (PORCINE) IN NACL 2-0.9 UNIT/ML-% IJ SOLN
INTRAMUSCULAR | Status: DC | PRN
  Administered 2016-12-21: 12:00:00 via INTRA_ARTERIAL

## 2016-12-21 MED ORDER — MIDAZOLAM HCL 2 MG/2ML IJ SOLN
INTRAMUSCULAR | Status: AC
  Filled 2016-12-21: qty 2

## 2016-12-21 SURGICAL SUPPLY — 12 items

## 2016-12-21 NOTE — Progress Notes (Signed)
   12/21/16 0945  Clinical Encounter Type  Visited With Patient and family together  Visit Type Initial  Referral From Patient  Consult/Referral To Chaplain  Spiritual Encounters  Spiritual Needs Prayer;Emotional  Stress Factors  Patient Stress Factors None identified  Family Stress Factors Exhausted  Pt will be discharged tomorrow, spouse is @bedside .  Offered prayer, emotional support, active listening.  Advised patient if further needed assistance from pastoral spiritual care to let Nurse know, to call pastoral care.  Chaplain Ezme Duch A. Nonnie Done, MA-PC [phone]

## 2016-12-21 NOTE — Interval H&P Note (Signed)
History and Physical Interval Note:  12/21/2016 11:25 AM  David Underwood  has presented today for surgery, with the diagnosis of unstable angina  The various methods of treatment have been discussed with the patient and family. After consideration of risks, benefits and other options for treatment, the patient has consented to  Procedure(s): Left Heart Cath and Coronary Angiography (N/A) as a surgical intervention .  The patient's history has been reviewed, patient examined, no change in status, stable for surgery.  I have reviewed the patient's chart and labs.  Questions were answered to the patient's satisfaction.   Cath Lab Visit (complete for each Cath Lab visit)  Clinical Evaluation Leading to the Procedure:   ACS: Yes.    Non-ACS:    Anginal Classification: CCS IV  Anti-ischemic medical therapy: Minimal Therapy (1 class of medications)  Non-Invasive Test Results: No non-invasive testing performed  Prior CABG: No previous CABG        Luana Shu, [facility] 12/21/2016 11:26 AM

## 2016-12-21 NOTE — H&P (View-Only) (Signed)
Progress Note  Patient Name: David Underwood Date of Encounter: 12/21/2016  Primary Cardiologist: Gwenlyn Saran   Subjective   Patient going for cath today. He has not had any chest pain but endorses associated back pain 5/10 that was constant throughout the night. He has not has any SOB, denies any leg swelling.  Inpatient Medications    Scheduled Meds: . aspirin EC  81 mg Oral Daily  . carvedilol  6.25 mg Oral BID WC  . furosemide  40 mg Oral Daily  . pantoprazole  40 mg Oral Daily  . potassium chloride  20 mEq Oral Daily  . rosuvastatin  20 mg Oral q1800  . sacubitril-valsartan  1 tablet Oral BID   Continuous Infusions: . sodium chloride 10 mL/hr at 12/21/16 0531  . heparin 1,000 Units/hr (12/20/16 2030)  . nitroGLYCERIN 20 mcg/min (12/20/16 2124)   PRN Meds: acetaminophen, alum & mag hydroxide-simeth, nitroGLYCERIN, ondansetron (ZOFRAN) IV, simethicone   Vital Signs    Vitals:   12/21/16 0145 12/21/16 0230 12/21/16 0528 12/21/16 0755  BP: (!) 151/112 (!) 141/92 131/64 (!) 143/57  Pulse: 64 (!) 34 87 87  Resp:   16 16  Temp:   99 F (37.2 C) 99.2 F (37.3 C)  TempSrc:   Oral Oral  SpO2: 95% 91% 90% 92%  Weight:   209 lb 11.2 oz (95.1 kg)   Height:        Intake/Output Summary (Last 24 hours) at 12/21/16 0845 Last data filed at 12/21/16 0537  Gross per 24 hour  Intake           896.68 ml  Output             4100 ml  Net         -3203.32 ml   Filed Weights   12/20/16 0327 12/21/16 0528  Weight: 216 lb (98 kg) 209 lb 11.2 oz (95.1 kg)    Telemetry    HR 90's, multiple PVCs and a few episodes of 2-3 beats of ventricular tachycardia - Personally Reviewed   Physical Exam   GEN: Well nourished, well developed HEENT: normal  Neck: no JVD, carotid bruits, or masses Cardiac: RRR. no murmurs, rubs, or gallops,no edema. Intact distal pulses bilaterally.  Respiratory: clear to auscultation bilaterally, normal work of breathing GI: soft, nontender,  nondistended, + BS MS: no deformity or atrophy  Skin: warm and dry, no rash Neuro: Alert and Oriented x 3, Strength and sensation are intact Psych:   Full affect  Labs    Chemistry Recent Labs Lab 12/19/16 2258 12/20/16 0454  NA 140 138  K 3.6 3.6  CL 109 107  CO2 24 24  GLUCOSE 132* 116*  BUN 12 10  CREATININE 1.43* 1.39*  CALCIUM 9.5 9.0  PROT 7.5 6.2*  ALBUMIN 4.1 3.4*  AST 24 20  ALT 24 20  ALKPHOS 59 50  BILITOT 0.9 1.1  GFRNONAA 50* 52*  GFRAA 58* 60*  ANIONGAP 7 7     Hematology Recent Labs Lab 12/19/16 2258 12/20/16 0454 12/21/16 0639  WBC 5.8 6.8 10.3  RBC 5.24 4.59 5.04  HGB 16.5 14.0 15.9  HCT 46.7 41.7 45.8  MCV 89.1 90.8 90.9  MCH 31.5 30.5 31.5  MCHC 35.3 33.6 34.7  RDW 12.5 12.9 12.7  PLT 185 178 179    Cardiac Enzymes Recent Labs Lab 12/20/16 0454 12/20/16 0958 12/20/16 1809 12/20/16 2223  TROPONINI 0.26* 0.27* 0.30* 0.37*   No results for input(s):  TROPIPOC in the last 168 hours.   BNP Recent Labs Lab 12/19/16 2258  BNP 445.3*     DDimer No results for input(s): DDIMER in the last 168 hours.   Radiology    Dg Chest 2 View  Result Date: 12/19/2016 CLINICAL DATA:  Acute onset of epigastric abdominal pain and pressure. Indigestion. Initial encounter. EXAM: CHEST  2 VIEW COMPARISON:  None. FINDINGS: The lungs are well-aerated. Pulmonary vascularity is at the upper limits of normal. There is no evidence of focal opacification, pleural effusion or pneumothorax. The heart is borderline normal in size. No acute osseous abnormalities are seen. IMPRESSION: No acute cardiopulmonary process seen. Electronically Signed   By: Garald Balding M.D.   On: 12/19/2016 23:18    Cardiac Studies   Transthoracic Echocardiography 12/20/2016  Study Conclusions  - Left ventricle: The cavity size was mildly dilated. Wall   thickness was normal. Systolic function was severely reduced. The   estimated ejection fraction was in the range of 20% to  25%.   Diffuse hypokinesis. Features are consistent with a pseudonormal   left ventricular filling pattern, with concomitant abnormal   relaxation and increased filling pressure (grade 2 diastolic   dysfunction). - Aortic valve: There was mild regurgitation. - Mitral valve: There was mild to moderate regurgitation. - Right atrium: The atrium was mildly dilated. - Tricuspid valve: There was moderate regurgitation. - Pulmonary arteries: Systolic pressure was moderately increased.   PA peak pressure: 40 mm Hg (S).   Patient Profile     66 y.o. male  He has a history of GERD, colon CA s/p colostomy and HTN and presented to [facility] with 2 week history of epigastric pain.  He has a history of GERD/esophagitis and thought it was related to that but the pain got worse last night at 9/10 and was associated with N/V and he became diaphoretic. He admits to having problems with DOE as well as orthopnea.   In ER pain was worse with palpation of the epigastrium but troponin noted to be elevated at 0.26 and BNP elevated at 445.  CXRAY showed NAD.  EKG showed sinus bradycardia with nonspecific IVCD and diffuse T wave abnormality c/w inferolateral ischemia and anterior infarct.  He was transferred to [facility] for further evaluation.      Assessment & Plan    1.  Chest/epigastric pain/NSTEMI - somewhat atypical in that it was initially worse with palpation of epigastric region but associated with N/V and diaphoresis and he has had DOE for several weeks.  Initial trop elevated at 0.26 << 0.30 << 0.37. EKG very worrisome for ischemia with inferolateral T wave abnormality - There is no old EKG for comparison.   --  Scheduled for cardiac catheterization today. --  Continue IV heparin and ASA 81 mg daily --  2D echo  Performed and shows  EF 20-25 % and diffuse hypokinesis, grade 2 diastolic dyscuntion --  Continue coreg 12.5mg  BID  --  Lipitor to 80mg  daily  2. Acute on chronic systolic heart failure:  --  clear chest xray, BNP is 445.3 -- strict I/O's and daily weights, she has net loss of 2.666.6 liters since admission on 40 mg IV daily of Lasix -- Amlodipine 5mg  started and discontinued after 1 dose she was switched to Entresto 24/26 mg PO BID  3.  GERD with history of reflux esophagitis - continue PPI  4.  HTN - BP poorly controlled on current meds.   -- Carvedilol cannot be  tritiated further due to HR in the 60's. -- Creatinine 1.43 so will avoid ACE I /ARB.   4.  Hyperlipidemia  (LDL 109)   Lipitor to 80mg  daily   Signed, Linus Mako, PA-C  12/21/2016, 8:45 AM    Attending Note:   The patient was seen and examined.  Agree with assessment and plan as noted above.  Changes made to the above note as needed.  Patient seen and independently examined with Delos Haring, PA .   We discussed all aspects of the encounter. I agree with the assessment and plan as stated above.  1.  Unstable angina :   His angina equivalent seems to be abdominal pain .  This pain responds to NTG.   Has been pain free on IV NTG. Troponins continue to gradually increase  For cath today   2. Acute on Chronic systolic CHF:   EF 13-08%.  Getting started on Entresto 24/26 mg   3.  HTN:   Continue Coreg and Entresto. , lasix      I have spent a total of 40 minutes with patient reviewing hospital  notes , telemetry, EKGs, labs and examining patient as well as establishing an assessment and plan that was discussed with the patient. > 50% of time was spent in direct patient care.    Thayer Headings, Brooke Bonito., MD, [facility] 12/21/2016, 10:34 AM [address]  [place] Pager [phone]

## 2016-12-21 NOTE — Progress Notes (Signed)
Progress Note  Patient Name: David Underwood Date of Encounter: 12/21/2016  Primary Cardiologist: Gwenlyn Saran   Subjective   Patient going for cath today. He has not had any chest pain but endorses associated back pain 5/10 that was constant throughout the night. He has not has any SOB, denies any leg swelling.  Inpatient Medications    Scheduled Meds: . aspirin EC  81 mg Oral Daily  . carvedilol  6.25 mg Oral BID WC  . furosemide  40 mg Oral Daily  . pantoprazole  40 mg Oral Daily  . potassium chloride  20 mEq Oral Daily  . rosuvastatin  20 mg Oral q1800  . sacubitril-valsartan  1 tablet Oral BID   Continuous Infusions: . sodium chloride 10 mL/hr at 12/21/16 0531  . heparin 1,000 Units/hr (12/20/16 2030)  . nitroGLYCERIN 20 mcg/min (12/20/16 2124)   PRN Meds: acetaminophen, alum & mag hydroxide-simeth, nitroGLYCERIN, ondansetron (ZOFRAN) IV, simethicone   Vital Signs    Vitals:   12/21/16 0145 12/21/16 0230 12/21/16 0528 12/21/16 0755  BP: (!) 151/112 (!) 141/92 131/64 (!) 143/57  Pulse: 64 (!) 34 87 87  Resp:   16 16  Temp:   99 F (37.2 C) 99.2 F (37.3 C)  TempSrc:   Oral Oral  SpO2: 95% 91% 90% 92%  Weight:   209 lb 11.2 oz (95.1 kg)   Height:        Intake/Output Summary (Last 24 hours) at 12/21/16 0845 Last data filed at 12/21/16 0537  Gross per 24 hour  Intake           896.68 ml  Output             4100 ml  Net         -3203.32 ml   Filed Weights   12/20/16 0327 12/21/16 0528  Weight: 216 lb (98 kg) 209 lb 11.2 oz (95.1 kg)    Telemetry    HR 90's, multiple PVCs and a few episodes of 2-3 beats of ventricular tachycardia - Personally Reviewed   Physical Exam   GEN: Well nourished, well developed HEENT: normal  Neck: no JVD, carotid bruits, or masses Cardiac: RRR. no murmurs, rubs, or gallops,no edema. Intact distal pulses bilaterally.  Respiratory: clear to auscultation bilaterally, normal work of breathing GI: soft, nontender,  nondistended, + BS MS: no deformity or atrophy  Skin: warm and dry, no rash Neuro: Alert and Oriented x 3, Strength and sensation are intact Psych:   Full affect  Labs    Chemistry Recent Labs Lab 12/19/16 2258 12/20/16 0454  NA 140 138  K 3.6 3.6  CL 109 107  CO2 24 24  GLUCOSE 132* 116*  BUN 12 10  CREATININE 1.43* 1.39*  CALCIUM 9.5 9.0  PROT 7.5 6.2*  ALBUMIN 4.1 3.4*  AST 24 20  ALT 24 20  ALKPHOS 59 50  BILITOT 0.9 1.1  GFRNONAA 50* 52*  GFRAA 58* 60*  ANIONGAP 7 7     Hematology Recent Labs Lab 12/19/16 2258 12/20/16 0454 12/21/16 0639  WBC 5.8 6.8 10.3  RBC 5.24 4.59 5.04  HGB 16.5 14.0 15.9  HCT 46.7 41.7 45.8  MCV 89.1 90.8 90.9  MCH 31.5 30.5 31.5  MCHC 35.3 33.6 34.7  RDW 12.5 12.9 12.7  PLT 185 178 179    Cardiac Enzymes Recent Labs Lab 12/20/16 0454 12/20/16 0958 12/20/16 1809 12/20/16 2223  TROPONINI 0.26* 0.27* 0.30* 0.37*   No results for input(s):  TROPIPOC in the last 168 hours.   BNP Recent Labs Lab 12/19/16 2258  BNP 445.3*     DDimer No results for input(s): DDIMER in the last 168 hours.   Radiology    Dg Chest 2 View  Result Date: 12/19/2016 CLINICAL DATA:  Acute onset of epigastric abdominal pain and pressure. Indigestion. Initial encounter. EXAM: CHEST  2 VIEW COMPARISON:  None. FINDINGS: The lungs are well-aerated. Pulmonary vascularity is at the upper limits of normal. There is no evidence of focal opacification, pleural effusion or pneumothorax. The heart is borderline normal in size. No acute osseous abnormalities are seen. IMPRESSION: No acute cardiopulmonary process seen. Electronically Signed   By: Garald Balding M.D.   On: 12/19/2016 23:18    Cardiac Studies   Transthoracic Echocardiography 12/20/2016  Study Conclusions  - Left ventricle: The cavity size was mildly dilated. Wall   thickness was normal. Systolic function was severely reduced. The   estimated ejection fraction was in the range of 20% to  25%.   Diffuse hypokinesis. Features are consistent with a pseudonormal   left ventricular filling pattern, with concomitant abnormal   relaxation and increased filling pressure (grade 2 diastolic   dysfunction). - Aortic valve: There was mild regurgitation. - Mitral valve: There was mild to moderate regurgitation. - Right atrium: The atrium was mildly dilated. - Tricuspid valve: There was moderate regurgitation. - Pulmonary arteries: Systolic pressure was moderately increased.   PA peak pressure: 40 mm Hg (S).   Patient Profile     66 y.o. male  He has a history of GERD, colon CA s/p colostomy and HTN and presented to [facility] with 2 week history of epigastric pain.  He has a history of GERD/esophagitis and thought it was related to that but the pain got worse last night at 9/10 and was associated with N/V and he became diaphoretic. He admits to having problems with DOE as well as orthopnea.   In ER pain was worse with palpation of the epigastrium but troponin noted to be elevated at 0.26 and BNP elevated at 445.  CXRAY showed NAD.  EKG showed sinus bradycardia with nonspecific IVCD and diffuse T wave abnormality c/w inferolateral ischemia and anterior infarct.  He was [facility] to [facility] for [facility].      Assessment & Plan    1.  Chest/epigastric pain/NSTEMI - somewhat atypical in that it was initially worse with palpation of epigastric region but associated with N/V and diaphoresis and he has had DOE for several weeks.  Initial trop elevated at 0.26 << 0.30 << 0.37. EKG very worrisome for ischemia with inferolateral T wave abnormality - There is no old EKG for comparison.   --  Scheduled for cardiac catheterization today. --  Continue IV heparin and ASA 81 mg daily --  2D echo  Performed and shows  EF 20-25 % and diffuse hypokinesis, grade 2 diastolic dyscuntion --  Continue coreg 12.5mg  BID  --  Lipitor to 80mg  daily  2. Acute on chronic systolic heart failure:  --  clear chest xray, BNP is 445.3 -- strict I/O's and daily weights, she has net loss of 2.666.6 liters since admission on 40 mg IV daily of Lasix -- Amlodipine 5mg  started and discontinued after 1 dose she was switched to Entresto 24/26 mg PO BID  3.  GERD with history of reflux esophagitis - continue PPI  4.  HTN - BP poorly controlled on current meds.   -- Carvedilol cannot be  tritiated further due to HR in the 60's. -- Creatinine 1.43 so will avoid ACE I /ARB.   4.  Hyperlipidemia  (LDL 109)   Lipitor to 80mg  daily   Signed, Linus Mako, PA-C  12/21/2016, 8:45 AM    Attending Note:   The patient was seen and examined.  Agree with assessment and plan as noted above.  Changes made to the above note as needed.  Patient seen and independently examined with Delos Haring, PA .   We discussed all aspects of the encounter. I agree with the assessment and plan as stated above.  1.  Unstable angina :   His angina equivalent seems to be abdominal pain .  This pain responds to NTG.   Has been pain free on IV NTG. Troponins continue to gradually increase  For cath today   2. Acute on Chronic systolic CHF:   EF 27-03%.  Getting started on Entresto 24/26 mg   3.  HTN:   Continue Coreg and Entresto. , lasix      I have spent a total of 40 minutes with patient reviewing hospital  notes , telemetry, EKGs, labs and examining patient as well as establishing an assessment and plan that was discussed with the patient. > 50% of time was spent in direct patient care.    Thayer Headings, Brooke Bonito., MD, [facility] 12/21/2016, 10:34 AM [address],  [place] Pager [phone]

## 2016-12-21 NOTE — Progress Notes (Signed)
ANTICOAGULATION CONSULT NOTE - Follow Up Consult  Pharmacy Consult for Heparin Indication: chest pain/ACS  Allergies  Allergen Reactions  . Aspirin     "stomach irritation", GI bleeding, also mentions colon CA    Patient Measurements: Height: 5\' 11"  (180.3 cm) Weight: 209 lb 11.2 oz (95.1 kg) IBW/kg (Calculated) : 75.3 Heparin Dosing Weight: 95kg  Vital Signs: Temp: 99.2 F (37.3 C) (05/29 0755) Temp Source: Oral (05/29 0755) BP: 143/57 (05/29 0755) Pulse Rate: 87 (05/29 0755)  Labs:  Recent Labs  12/19/16 2258 12/20/16 0454 12/20/16 0958 12/20/16 1228 12/20/16 1809 12/20/16 2223 12/21/16 0639  HGB 16.5 14.0  --   --   --   --  15.9  HCT 46.7 41.7  --   --   --   --  45.8  PLT 185 178  --   --   --   --  179  LABPROT  --  16.1*  --   --   --   --   --   INR  --  1.28  --   --   --   --   --   HEPARINUNFRC  --   --   --  0.47 0.43  --  0.38  CREATININE 1.43* 1.39*  --   --   --   --   --   TROPONINI 0.26* 0.26* 0.27*  --  0.30* 0.37*  --    Estimated Creatinine Clearance: 62.4 mL/min (A) (by C-G formula based on SCr of 1.39 mg/dL (H)).  Medications:  Heparin @ 1000 units/hr  Assessment: 66yom continues on heparin for NSTEMI with plan for cath today. Heparin level remains therapeutic at 0.38. CBC stable overnight, no bleeding documented.  Goal of Therapy:  Heparin level 0.3-0.7 units/ml Monitor platelets by anticoagulation protocol: Yes   Plan:  1) Continue heparin at 1000 units/hr  2) Daily heparin level and CBC  Allegra Cerniglia L Robin Pafford 12/21/2016,8:57 AM

## 2016-12-21 NOTE — Progress Notes (Signed)
TR band removed & pt tolerated it well. Site care & education given to both pt & wife at bedside. Site is a level 0. Will continue to monitor the pt Kashif Pooler, Corrinne Eagle, RN

## 2016-12-22 ENCOUNTER — Other Ambulatory Visit: Payer: Self-pay | Admitting: Emergency Medicine

## 2016-12-22 DIAGNOSIS — I5043 Acute on chronic combined systolic (congestive) and diastolic (congestive) heart failure: Secondary | ICD-10-CM

## 2016-12-22 DIAGNOSIS — I5023 Acute on chronic systolic (congestive) heart failure: Secondary | ICD-10-CM

## 2016-12-22 DIAGNOSIS — I2510 Atherosclerotic heart disease of native coronary artery without angina pectoris: Secondary | ICD-10-CM

## 2016-12-22 LAB — BASIC METABOLIC PANEL
Anion gap: 10 (ref 5–15)
BUN: 17 mg/dL (ref 6–20)
CO2: 23 mmol/L (ref 22–32)
CREATININE: 1.59 mg/dL — AB (ref 0.61–1.24)
Calcium: 9.3 mg/dL (ref 8.9–10.3)
Chloride: 103 mmol/L (ref 101–111)
GFR calc non Af Amer: 44 mL/min — ABNORMAL LOW (ref >60)
GFR, EST AFRICAN AMERICAN: 51 mL/min — AB (ref >60)
Glucose, Bld: 155 mg/dL — ABNORMAL HIGH (ref 65–99)
Potassium: 3.5 mmol/L (ref 3.5–5.1)
Sodium: 136 mmol/L (ref 135–145)

## 2016-12-22 LAB — CBC
HEMATOCRIT: 48.3 % (ref 39.0–52.0)
HEMOGLOBIN: 16.6 g/dL (ref 13.0–17.0)
MCH: 31.4 pg (ref 26.0–34.0)
MCHC: 34.4 g/dL (ref 30.0–36.0)
MCV: 91.5 fL (ref 78.0–100.0)
Platelets: 190 10*3/uL (ref 150–400)
RBC: 5.28 MIL/uL (ref 4.22–5.81)
RDW: 12.9 % (ref 11.5–15.5)
WBC: 10.2 10*3/uL (ref 4.0–10.5)

## 2016-12-22 MED ORDER — FUROSEMIDE 40 MG PO TABS: 40.0000 mg | ORAL_TABLET | Freq: Every day | ORAL | 2 refills | Status: DC

## 2016-12-22 MED ORDER — POTASSIUM CHLORIDE CRYS ER 20 MEQ PO TBCR: 20.0000 meq | EXTENDED_RELEASE_TABLET | Freq: Every day | ORAL | 2 refills | Status: DC

## 2016-12-22 MED ORDER — LIVING BETTER WITH HEART FAILURE BOOK
Freq: Once | Status: DC
  Filled 2016-12-22: qty 1

## 2016-12-22 MED ORDER — SACUBITRIL-VALSARTAN 24-26 MG PO TABS: 1.0000 | ORAL_TABLET | Freq: Two times a day (BID) | ORAL | 1 refills | Status: DC

## 2016-12-22 MED ORDER — NITROGLYCERIN 0.4 MG SL SUBL: 0.4000 mg | SUBLINGUAL_TABLET | SUBLINGUAL | 12 refills | Status: AC | PRN

## 2016-12-22 MED ORDER — ASPIRIN 81 MG PO TBEC: 81.0000 mg | DELAYED_RELEASE_TABLET | Freq: Every day | ORAL | 11 refills | Status: AC

## 2016-12-22 MED ORDER — PANTOPRAZOLE SODIUM 40 MG PO TBEC: 40.0000 mg | DELAYED_RELEASE_TABLET | Freq: Every day | ORAL | 5 refills | Status: DC

## 2016-12-22 NOTE — Discharge Instructions (Signed)
Heart Failure °Heart failure is a condition in which the heart has trouble pumping blood because it has become weak or stiff. This means that the heart does not pump blood efficiently for the body to work well. For some people with heart failure, fluid may back up into the lungs and there may be swelling (edema) in the lower legs. Heart failure is usually a long-term (chronic) condition. It is important for you to take good care of yourself and follow the treatment plan from your health care provider. °What are the causes? °This condition is caused by some health problems, including: °· High blood pressure (hypertension). Hypertension causes the heart muscle to work harder than normal. High blood pressure eventually causes the heart to become stiff and weak. °· Coronary artery disease (CAD). CAD is the buildup of cholesterol and fat (plaques) in the arteries of the heart. °· Heart attack (myocardial infarction). Injured tissue, which is caused by the heart attack, does not contract as well and the heart's ability to pump blood is weakened. °· Abnormal heart valves. When the heart valves do not open and close properly, the heart muscle must pump harder to keep the blood flowing. °· Heart muscle disease (cardiomyopathy or myocarditis). Heart muscle disease is damage to the heart muscle from a variety of causes, such as drug or alcohol abuse, infections, or unknown causes. These can increase the risk of heart failure. °· Lung disease. When the lungs do not work properly, the heart must work harder. ° °What increases the risk? °Risk of heart failure increases as a person ages. This condition is also more likely to develop in people who: °· Are overweight. °· Are male. °· Smoke or chew tobacco. °· Abuse alcohol or illegal drugs. °· Have taken medicines that can damage the heart, such as chemotherapy drugs. °· Have diabetes. °? High blood sugar (glucose) is associated with high fat (lipid) levels in the blood. °? Diabetes  can also damage tiny blood vessels that carry nutrients to the heart muscle. °· Have abnormal heart rhythms. °· Have thyroid problems. °· Have low blood counts (anemia). ° °What are the signs or symptoms? °Symptoms of this condition include: °· Shortness of breath with activity, such as when climbing stairs. °· Persistent cough. °· Swelling of the feet, ankles, legs, or abdomen. °· Unexplained weight gain. °· Difficulty breathing when lying flat (orthopnea). °· Waking from sleep because of the need to sit up and get more air. °· Rapid heartbeat. °· Fatigue and loss of energy. °· Feeling light-headed, dizzy, or close to fainting. °· Loss of appetite. °· Nausea. °· Increased urination during the night (nocturia). °· Confusion. ° °How is this diagnosed? °This condition is diagnosed based on: °· Medical history, symptoms, and a physical exam. °· Diagnostic tests, which may include: °? Echocardiogram. °? Electrocardiogram (ECG). °? Chest X-ray. °? Blood tests. °? Exercise stress test. °? Radionuclide scans. °? Cardiac catheterization and angiogram. ° °How is this treated? °Treatment for this condition is aimed at managing the symptoms of heart failure. Medicines, behavioral changes, or other treatments may be necessary to treat heart failure. °Medicines °These may include: °· Angiotensin-converting enzyme (ACE) inhibitors. This type of medicine blocks the effects of a blood protein called angiotensin-converting enzyme. ACE inhibitors relax (dilate) the blood vessels and help to lower blood pressure. °· Angiotensin receptor blockers (ARBs). This type of medicine blocks the actions of a blood protein called angiotensin. ARBs dilate the blood vessels and help to lower blood pressure. °· Water   pills (diuretics). Diuretics cause the kidneys to remove salt and water from the blood. The extra fluid is removed through urination, leaving a lower volume of blood that the heart has to pump. °· Beta blockers. These improve heart  muscle strength and they prevent the heart from beating too quickly. °· Digoxin. This increases the force of the heartbeat. ° °Healthy behavior changes °These may include: °· Reaching and maintaining a healthy weight. °· Stopping smoking or chewing tobacco. °· Eating heart-healthy foods. °· Limiting or avoiding alcohol. °· Stopping use of street drugs (illegal drugs). °· Physical activity. ° °Other treatments °These may include: °· Surgery to open blocked coronary arteries or repair damaged heart valves. °· Placement of a biventricular pacemaker to improve heart muscle function (cardiac resynchronization therapy). This device paces both the right ventricle and left ventricle. °· Placement of a device to treat serious abnormal heart rhythms (implantable cardioverter defibrillator, or ICD). °· Placement of a device to improve the pumping ability of the heart (left ventricular assist device, or LVAD). °· Heart transplant. This can cure heart failure, and it is considered for certain patients who do not improve with other therapies. ° °Follow these instructions at home: °Medicines °· Take over-the-counter and prescription medicines only as told by your health care provider. Medicines are important in reducing the workload of your heart, slowing the progression of heart failure, and improving your symptoms. °? Do not stop taking your medicine unless your health care provider told you to do that. °? Do not skip any dose of medicine. °? Refill your prescriptions before you run out of medicine. You need your medicines every day. °Eating and drinking ° °· Eat heart-healthy foods. Talk with a dietitian to make an eating plan that is right for you. °? Choose foods that contain no trans fat and are low in saturated fat and cholesterol. Healthy choices include fresh or frozen fruits and vegetables, fish, lean meats, legumes, fat-free or low-fat dairy products, and whole-grain or high-fiber foods. °? Limit salt (sodium) if  directed by your health care provider. Sodium restriction may reduce symptoms of heart failure. Ask a dietitian to recommend heart-healthy seasonings. °? Use healthy cooking methods instead of frying. Healthy methods include roasting, grilling, broiling, baking, poaching, steaming, and stir-frying. °· Limit your fluid intake if directed by your health care provider. Fluid restriction may reduce symptoms of heart failure. °Lifestyle °· Stop smoking or using chewing tobacco. Nicotine and tobacco can damage your heart and your blood vessels. Do not use nicotine gum or patches before talking to your health care provider. °· Limit alcohol intake to no more than 1 drink per day for non-pregnant women and 2 drinks per day for men. One drink equals 12 oz of beer, 5 oz of wine, or 1½ oz of hard liquor. °? Drinking more than that is harmful to your heart. Tell your health care provider if you drink alcohol several times a week. °? Talk with your health care provider about whether any level of alcohol use is safe for you. °? If your heart has already been damaged by alcohol or you have severe heart failure, drinking alcohol should be stopped completely. °· Stop use of illegal drugs. °· Lose weight if directed by your health care provider. Weight loss may reduce symptoms of heart failure. °· Do moderate physical activity if directed by your health care provider. People who are elderly and people with severe heart failure should consult with a health care provider for physical activity recommendations. °  Monitor important information °· Weigh yourself every day. Keeping track of your weight daily helps you to notice excess fluid sooner. °? Weigh yourself every morning after you urinate and before you eat breakfast. °? Wear the same amount of clothing each time you weigh yourself. °? Record your daily weight. Provide your health care provider with your weight record. °· Monitor and record your blood pressure as told by your health  care provider. °· Check your pulse as told by your health care provider. °Dealing with extreme temperatures °· If the weather is extremely hot: °? Avoid vigorous physical activity. °? Use air conditioning or fans or seek a cooler location. °? Avoid caffeine and alcohol. °? Wear loose-fitting, lightweight, and light-colored clothing. °· If the weather is extremely cold: °? Avoid vigorous physical activity. °? Layer your clothes. °? Wear mittens or gloves, a hat, and a scarf when you go outside. °? Avoid alcohol. °General instructions °· Manage other health conditions such as hypertension, diabetes, thyroid disease, or abnormal heart rhythms as told by your health care provider. °· Learn to manage stress. If you need help to do this, ask your health care provider. °· Plan rest periods when fatigued. °· Get ongoing education and support as needed. °· Participate in or seek rehabilitation as needed to maintain or improve independence and quality of life. °· Stay up to date with immunizations. Keeping current on pneumococcal and influenza immunizations is especially important to prevent respiratory infections. °· Keep all follow-up visits as told by your health care provider. This is important. °Contact a health care provider if: °· You have a rapid weight gain. °· You have increasing shortness of breath that is unusual for you. °· You are unable to participate in your usual physical activities. °· You tire easily. °· You cough more than normal, especially with physical activity. °· You have any swelling or more swelling in areas such as your hands, feet, ankles, or abdomen. °· You are unable to sleep because it is hard to breathe. °· You feel like your heart is beating quickly (palpitations). °· You become dizzy or light-headed when you stand up. °Get help right away if: °· You have difficulty breathing. °· You notice or your family notices a change in your awareness, such as having trouble staying awake or having  difficulty with concentration. °· You have pain or discomfort in your chest. °· You have an episode of fainting (syncope). °This information is not intended to replace advice given to you by your health care provider. Make sure you discuss any questions you have with your health care provider. °Document Released: 07/12/2005 Document Revised: 03/16/2016 Document Reviewed: 02/04/2016 °Elsevier Interactive Patient Education © 2017 [employer] ° °

## 2016-12-22 NOTE — Care Management Note (Signed)
Case Management Note  Patient Details  Name: David Underwood MRN: [id number] Date of Birth: Aug 28, 1950  Subjective/Objective:    Pt presented for [facility]. Acute on Chronic Heart Failure-plan for Entresto for home.                Action/Plan: CM did provide pt with 30 day free [employer]. [facility] at [address]-has to order medication. CM did call [facility] and medication is available. Pt will go over with paper Rx for medication. Co pay is 124.80 and pt is aware. Pt to enroll on entresto central for additional assistance. CM did make them aware to ask for samples. No further needs from CM at this time.   Expected Discharge Date:  12/22/16               Expected Discharge Plan:  Home/Self Care  In-House Referral:  NA  Discharge planning Services  CM Consult, Medication Assistance  Post Acute Care Choice:  NA Choice offered to:  NA  DME Arranged:  N/A DME Agency:  NA  HH Arranged:  NA HH Agency:  NA  Status of Service:  Completed, signed off  If discussed at [place] of Stay Meetings, dates discussed:    Additional Comments:  Bethena Roys, RN 12/22/2016, 1:07 PM

## 2016-12-22 NOTE — Progress Notes (Signed)
Delos Haring, PA has informed me that the patient's pharmacy does in fact have Entresto available.  Pt and family updated.  They will not need the paper RX and will be able to pick up this new med at their pharmacy in [facility].  CM, Brenda notified.

## 2016-12-22 NOTE — Progress Notes (Signed)
CARDIAC REHAB PHASE I   PRE:  Rate/Rhythm: 86 SR with PVCs    BP: sitting 140/79    SaO2: 97 RA  MODE:  Ambulation: 400 ft   POST:  Rate/Rhythm: 104 ST with many PVCs    BP: sitting 135/67     SaO2: 96 RA  Pt feeling fairly well, sts his legs feel weak. Pt with many PVCs, bigeminy at times, couplets. He seems asx. Slight increase in ectopy with walking and increase continued at rest after walking. Ed completed with pt and wife. Discussed in depth HF, booklet, video. Gave walking gl and referred to [facility]. Set up HF video. Discussed PVCs with RN who is paging PA.  [phone]  Quinn, ACSM 12/22/2016 11:09 AM

## 2016-12-22 NOTE — Progress Notes (Signed)
Progress Note  Patient Name: David Underwood Date of Encounter: 12/22/2016  Primary Cardiologist: new, [facility]  Subjective   Pt was admitted with abdominal pain - ? Angina equivalent. Cath showed non obstructive disease   Inpatient Medications    Scheduled Meds: . aspirin EC  81 mg Oral Daily  . carvedilol  6.25 mg Oral BID WC  . enoxaparin (LOVENOX) injection  40 mg Subcutaneous Q24H  . furosemide  40 mg Oral Daily  . pantoprazole  40 mg Oral Daily  . potassium chloride  20 mEq Oral Daily  . rosuvastatin  20 mg Oral q1800  . sacubitril-valsartan  1 tablet Oral BID  . sodium chloride flush  3 mL Intravenous Q12H   Continuous Infusions: . sodium chloride     PRN Meds: sodium chloride, acetaminophen, alum & mag hydroxide-simeth, nitroGLYCERIN, ondansetron (ZOFRAN) IV, simethicone, sodium chloride flush   Vital Signs    Vitals:   12/21/16 1715 12/21/16 1800 12/21/16 1950 12/22/16 0430  BP: 138/71  138/70 (!) 144/68  Pulse: (!) 26 85 82 91  Resp: 20     Temp:   98.2 F (36.8 C) 98.3 F (36.8 C)  TempSrc:   Oral Oral  SpO2: 97%  98% 99%  Weight:    207 lb 12.8 oz (94.3 kg)  Height:        Intake/Output Summary (Last 24 hours) at 12/22/16 0747 Last data filed at 12/22/16 0600  Gross per 24 hour  Intake              360 ml  Output             1300 ml  Net             -940 ml   Filed Weights   12/20/16 0327 12/21/16 0528 12/22/16 0430  Weight: 216 lb (98 kg) 209 lb 11.2 oz (95.1 kg) 207 lb 12.8 oz (94.3 kg)    Telemetry    Ventricular Bigeminy, HR 94 - Personally Reviewed   Physical Exam   GEN: Well nourished, well developed HEENT: normal  Neck: no JVD, carotid bruits, or masses Cardiac: RRR. no murmurs, rubs, or gallops,no edema. Intact distal pulses bilaterally.  Respiratory: clear to auscultation bilaterally, normal work of breathing GI: soft, nontender, nondistended, + BS MS: no deformity or atrophy, right wrist catheter site is  stable. Skin: warm and dry, no rash Neuro: Alert and Oriented x 3, Strength and sensation are intact Psych:   Full affect  Labs    Chemistry Recent Labs Lab 12/19/16 2258 12/20/16 0454 12/21/16 0928 12/21/16 1559  NA 140 138 136  --   K 3.6 3.6 3.5  --   CL 109 107 103  --   CO2 24 24 23   --   GLUCOSE 132* 116* 98  --   BUN 12 10 11   --   CREATININE 1.43* 1.39* 1.42* 1.50*  CALCIUM 9.5 9.0 9.2  --   PROT 7.5 6.2*  --   --   ALBUMIN 4.1 3.4*  --   --   AST 24 20  --   --   ALT 24 20  --   --   ALKPHOS 59 50  --   --   BILITOT 0.9 1.1  --   --   GFRNONAA 50* 52* 50* 47*  GFRAA 58* 60* 58* 55*  ANIONGAP 7 7 10   --      Hematology Recent Labs Lab 12/21/16 [phone] 12/21/16 1559 12/22/16  0423  WBC 10.3 8.2 10.2  RBC 5.04 5.00 5.28  HGB 15.9 15.4 16.6  HCT 45.8 45.7 48.3  MCV 90.9 91.4 91.5  MCH 31.5 30.8 31.4  MCHC 34.7 33.7 34.4  RDW 12.7 12.7 12.9  PLT 179 182 190    Cardiac Enzymes Recent Labs Lab 12/20/16 0454 12/20/16 0958 12/20/16 1809 12/20/16 2223  TROPONINI 0.26* 0.27* 0.30* 0.37*   No results for input(s): TROPIPOC in the last 168 hours.   BNP Recent Labs Lab 12/19/16 2258  BNP 445.3*      Radiology    No results found.  Cardiac Studies   Transthoracic Echocardiography 12/20/2016  Study Conclusions  - Left ventricle: The cavity size was mildly dilated. Wall thickness was normal. Systolic function was severely reduced. The estimated ejection fraction was in the range of 20% to 25%. Diffuse hypokinesis. Features are consistent with a pseudonormal left ventricular filling pattern, with concomitant abnormal relaxation and increased filling pressure (grade 2 diastolic dysfunction). - Aortic valve: There was mild regurgitation. - Mitral valve: There was mild to moderate regurgitation. - Right atrium: The atrium was mildly dilated. - Tricuspid valve: There was moderate regurgitation. - Pulmonary arteries: Systolic  pressure was moderately increased. PA peak pressure: 40 mm Hg (S).   Left Heart Cath and Coronary Angiography 12/22/2016   Conclusion     Mid RCA lesion, 30 %stenosed.  RPDA-2 lesion, 50 %stenosed.  RPDA-1 lesion, 50 %stenosed.  Prox LAD to Mid LAD lesion, 30 %stenosed.  Ost 2nd Mrg to 2nd Mrg lesion, 15 %stenosed.  There is severe left ventricular systolic dysfunction.  LV end diastolic pressure is normal.  The left ventricular ejection fraction is less than 25% by visual estimate.   1. Nonobstructive CAD 2. Severe LV dysfunction 3. Normal LV EDP  Plan: Medical management.      Patient Profile     66 y.o. male He has a history of GERD, colon CA s/p colostomy and HTN and presented to [facility] with 2 week history of epigastric pain. He has a history of GERD/esophagitis and thought it was related to that but the pain got worse last night at 9/10 and was associated with N/V and he became diaphoretic. He admits to having problems with DOE as well as orthopnea. In ER pain was worse with palpation of the epigastrium but troponin noted to be elevated at 0.26 and BNP elevated at 445. CXRAY showed NAD. EKG showed sinus bradycardia with nonspecific IVCD and diffuse T wave abnormality c/w inferolateral ischemia and anterior infarct. He was transferred to [facility] for further evaluation.    Assessment & Plan    Net loss since Admission: - 3.6 L  Today's Weight: 216 lb       Admission Weight:  207 lb  Blood Pressure: 113/68           Hr: 78  1. Unstable angina- He has non obstructive coronary artery disease  Cath revealed severe LV dysfunction. Recommendation is for medical therapy of CAD --  Continue coreg 12.5 mg BID  --  Lipitor to 80mg  daily  2. Acute on chronic systolic heart failure: Severe LV dysfunction. Diuresing well, down 3.6 L on 40 mg IV daily of Lasix -- clear chest xray, BNP is 445.3 -- Entresto 24/26 mg PO BID  3. GERD with history of reflux  esophagitis - continue PPI  4. HTN -- BP poorly controlled on current meds. Improved BP control since yesterday -- Carvedilol cannot be tritiated further due  to HR in the 60's. -- Creatinine 1.43 so will avoid ACE I /ARB.   5. Hyperlipidemia  (LDL 109)   Lipitor to 80mg  daily  If patient feeling well and able to ambulate may be able to go home.   Kristopher Glee, PA-C  12/22/2016, 7:47 AM     Attending Note:   The patient was seen and examined.  Agree with assessment and plan as noted above.  Changes made to the above note as needed.  Patient seen and independently examined with Delos Haring, PA .   We discussed all aspects of the encounter. I agree with the assessment and plan as stated above.  1.   Acute on chronic CHF:    EF 20-25% Non obstructive CAD  No further abdominal pain   He would like to follow up in our hospital office.   Will arrange for him to see Dr. Stanford Breed in a month or so   2. HTN:   Better on current meds    I have spent a total of 40 minutes with patient reviewing hospital  notes , telemetry, EKGs, labs and examining patient as well as establishing an assessment and plan that was discussed with the patient. > 50% of time was spent in direct patient care.    Thayer Headings, Brooke Bonito., MD, [facility] 12/22/2016, 8:54 AM [address]  [place] Pager [phone]

## 2016-12-22 NOTE — Discharge Summary (Signed)
Discharge Summary    Patient ID: David Underwood,  MRN: [id number], DOB/AGE: 04/16/1951 66 y.o.  Admit date: 12/19/2016 Discharge date: 12/22/2016  Primary Care Provider: [facility] Primary Cardiologist: Dr. Acie Fredrickson   Discharge Diagnoses    Principal Problem:   Acute on chronic combined systolic and diastolic CHF (congestive heart failure) ([place]) Active Problems:   Non-STEMI (non-ST elevated myocardial infarction) ([place])   Epigastric pain   Hypertension not at goal   Pure hypercholesterolemia   Coronary artery disease, non-occlusive   Allergies Allergies  Allergen Reactions  . Aspirin     "stomach irritation", GI bleeding, also mentions colon CA     History of Present Illness        66 y.o. male He has a history of GERD, colon CA s/p colostomy and HTN and [facility] with 2 week history of epigastric pain. He has a history of GERD/esophagitis and thought it was related to that but the pain got worse in the night at 9/10 and was associated with N/V and he became diaphoretic. He admits to having problems with DOE as well as orthopnea. In ER pain was worse with palpation of the epigastrium but troponin noted to be elevated at 0.26 and BNP elevated at 445. CXRAY showed NAD. EKG showed sinus bradycardia with nonspecific IVCD and diffuse T wave abnormality c/w inferolateral ischemia and anterior infarct. He was transferred to [facility] for further evaluation.    Hospital Course     Consultants: None  He did not experienced any further CP. His EF on echo was noted to be 20-25% on echo and he was started on Entresto. Aragon had a cardiac catheterization on 5/29, The catheterization showed nonobstructive CAD, severe LV dysfunction and normal LV EDP. The plan is for optomization of medical management and repeat echo in month to re-evaluate EF. Cardiac rehab ambulated the patient and he had PVC's which Dr. Acie Fredrickson is aware of. Repeat BMP at [facility]  follow-up to recheck his creatinine and potassium. At this visit a follow-up 1 month from discharge echo will need to be scheduled as well. If his EF does not recover after medical management is fully optimized and maximized will consider EP referral. Overall he is doing well and appears well.  The right radial catheter site is stable He  has been seen by Dr. Acie Fredrickson today and deemed ready for discharge home. All follow-up appointments have been scheduled.  A work excuse note was provided as well. Discharge medications are listed below.  _____________  Discharge Vitals Blood pressure 140/79, pulse (!) 37, temperature 99.3 F (37.4 C), temperature source Oral, resp. rate 14, height 5\' 11"  (1.803 m), weight 207 lb 12.8 oz (94.3 kg), SpO2 96 %.  Filed Weights   12/20/16 0327 12/21/16 0528 12/22/16 0430  Weight: 216 lb (98 kg) 209 lb 11.2 oz (95.1 kg) 207 lb 12.8 oz (94.3 kg)    Labs & Radiologic Studies     CBC  Recent Labs  12/19/16 2258 12/20/16 0454  12/21/16 1559 12/22/16 0423  WBC 5.8 6.8  < > 8.2 10.2  NEUTROABS 3.6 4.2  --   --   --   HGB 16.5 14.0  < > 15.4 16.6  HCT 46.7 41.7  < > 45.7 48.3  MCV 89.1 90.8  < > 91.4 91.5  PLT 185 178  < > 182 190  < > = values in this interval not displayed. Basic Metabolic Panel  Recent Labs  12/20/16 0454 12/21/16 0928 12/21/16 1559 12/22/16 0913  NA 138 136  --  136  K 3.6 3.5  --  3.5  CL 107 103  --  103  CO2 24 23  --  23  GLUCOSE 116* 98  --  155*  BUN 10 11  --  17  CREATININE 1.39* 1.42* 1.50* 1.59*  CALCIUM 9.0 9.2  --  9.3  MG 2.0  --   --   --    Liver Function Tests  Recent Labs  12/19/16 2258 12/20/16 0454  AST 24 20  ALT 24 20  ALKPHOS 59 50  BILITOT 0.9 1.1  PROT 7.5 6.2*  ALBUMIN 4.1 3.4*    Recent Labs  12/19/16 2258  LIPASE 27   Cardiac Enzymes  Recent Labs  12/20/16 0958 12/20/16 1809 12/20/16 2223  TROPONINI 0.27* 0.30* 0.37*   BNP Invalid input(s): POCBNP D-Dimer No results for  input(s): DDIMER in the last 72 hours. Hemoglobin A1C  Recent Labs  12/20/16 0454  HGBA1C 5.2   Fasting Lipid Panel  Recent Labs  12/20/16 0454  CHOL 168  HDL 48  LDLCALC 109*  TRIG 55  CHOLHDL 3.5   Thyroid Function Tests  Recent Labs  12/20/16 0454  TSH 1.197    Dg Chest 2 View  Result Date: 12/19/2016 CLINICAL DATA:  Acute onset of epigastric abdominal pain and pressure. Indigestion. Initial encounter. EXAM: CHEST  2 VIEW COMPARISON:  None. FINDINGS: The lungs are well-aerated. Pulmonary vascularity is at the upper limits of normal. There is no evidence of focal opacification, pleural effusion or pneumothorax. The heart is borderline normal in size. No acute osseous abnormalities are seen. IMPRESSION: No acute cardiopulmonary process seen. Electronically Signed   By: Garald Balding M.D.   On: 12/19/2016 23:18     Diagnostic Studies/Procedures     12/21/2016 Left Heart Cath and Coronary Angiography  Conclusion     Mid RCA lesion, 30 %stenosed.  RPDA-2 lesion, 50 %stenosed.  RPDA-1 lesion, 50 %stenosed.  Prox LAD to Mid LAD lesion, 30 %stenosed.  Ost 2nd Mrg to 2nd Mrg lesion, 15 %stenosed.  There is severe left ventricular systolic dysfunction.  LV end diastolic pressure is normal.  The left ventricular ejection fraction is less than 25% by visual estimate.   1. Nonobstructive CAD 2. Severe LV dysfunction 3. Normal LV EDP  Plan: Medical management.      Transthoracic Echocardiography 12/22/2016  Study Conclusions  - Left ventricle: The cavity size was mildly dilated. Wall   thickness was normal. Systolic function was severely reduced. The   estimated ejection fraction was in the range of 20% to 25%.   Diffuse hypokinesis. Features are consistent with a pseudonormal   left ventricular filling pattern, with concomitant abnormal   relaxation and increased filling pressure (grade 2 diastolic   dysfunction). - Aortic valve: There was mild  regurgitation. - Mitral valve: There was mild to moderate regurgitation. - Right atrium: The atrium was mildly dilated. - Tricuspid valve: There was moderate regurgitation. - Pulmonary arteries: Systolic pressure was moderately increased.   PA peak pressure: 40 mm Hg (S).  Disposition   Pt is being discharged home today in good condition.  Follow-up Plans & Appointments     Discharge Instructions    Amb Referral to Cardiac Rehabilitation    Complete by:  As directed    Diagnosis:  Heart Failure (see criteria below if ordering Phase II)   Heart Failure Type:  Chronic Systolic &  Diastolic   Diet - low sodium heart healthy    Complete by:  As directed    Increase activity slowly    Complete by:  As directed    May shower / Bathe    Complete by:  As directed       Discharge Medications   Allergies as of 12/22/2016      Reactions   Aspirin    "stomach irritation", GI bleeding, also mentions colon CA      Medication List    STOP taking these medications   omeprazole 20 MG capsule Commonly known as:  PRILOSEC     TAKE these medications   aspirin 81 MG EC tablet Take 1 tablet (81 mg total) by mouth daily. Start taking on:  12/23/2016   carvedilol 6.25 MG tablet Commonly known as:  COREG Take 6.25 mg by mouth 2 (two) times daily.   furosemide 40 MG tablet Commonly known as:  LASIX Take 1 tablet (40 mg total) by mouth daily.   nitroGLYCERIN 0.4 MG SL tablet Commonly known as:  NITROSTAT Place 1 tablet (0.4 mg total) under the tongue every 5 (five) minutes x 3 doses as needed for chest pain.   pantoprazole 40 MG tablet Commonly known as:  PROTONIX Take 1 tablet (40 mg total) by mouth daily. Start taking on:  12/23/2016 What changed:  medication strength  how much to take   potassium chloride SA 20 MEQ tablet Commonly known as:  K-DUR,KLOR-CON Take 1 tablet (20 mEq total) by mouth daily. Start taking on:  12/23/2016   rosuvastatin 20 MG tablet Commonly  known as:  CRESTOR Take 20 mg by mouth daily.   sacubitril-valsartan 24-26 MG Commonly known as:  ENTRESTO Take 1 tablet by mouth 2 (two) times daily.           Outstanding Labs/Studies   BMP and TOC in 1 week Repeat Echo in 1 month  Duration of Discharge Encounter   Greater than 30 minutes including physician time.  Kristopher Glee PA-C 12/22/2016, 12:55 PM  Attending Note:   The patient was seen and examined.  Agree with assessment and plan as noted above.  Changes made to the above note as needed.  Patient seen and independently examined with Delos Haring, PA .   We discussed all aspects of the encounter. I agree with the assessment and plan as stated above.  See progress note from same day  Pt is stable for DC     I have spent a total of 40 minutes with patient reviewing hospital  notes , telemetry, EKGs, labs and examining patient as well as establishing an assessment and plan that was discussed with the patient. > 50% of time was spent in direct patient care.    Thayer Headings, Brooke Bonito., MD, [facility] 12/23/2016, 11:00 AM [address]  [place] Pager [phone]

## 2016-12-23 ENCOUNTER — Encounter (HOSPITAL_COMMUNITY): Payer: Self-pay

## 2016-12-23 ENCOUNTER — Inpatient Hospital Stay (HOSPITAL_COMMUNITY)
Admission: AD | Admit: 2016-12-23 | Discharge: 2016-12-27 | DRG: 312 | Disposition: A | Discharge status: Home / Self Care | Payer: Medicare HMO | Source: Other Acute Inpatient Hospital | Attending: Cardiovascular Disease | Admitting: Cardiovascular Disease

## 2016-12-23 DIAGNOSIS — Z7982 Long term (current) use of aspirin: Secondary | ICD-10-CM | POA: Diagnosis not present

## 2016-12-23 DIAGNOSIS — Z85038 Personal history of other malignant neoplasm of large intestine: Secondary | ICD-10-CM

## 2016-12-23 DIAGNOSIS — R55 Syncope and collapse: Secondary | ICD-10-CM | POA: Diagnosis present

## 2016-12-23 DIAGNOSIS — K219 Gastro-esophageal reflux disease without esophagitis: Secondary | ICD-10-CM | POA: Diagnosis present

## 2016-12-23 DIAGNOSIS — I428 Other cardiomyopathies: Secondary | ICD-10-CM | POA: Diagnosis not present

## 2016-12-23 DIAGNOSIS — E869 Volume depletion, unspecified: Secondary | ICD-10-CM | POA: Diagnosis present

## 2016-12-23 DIAGNOSIS — I493 Ventricular premature depolarization: Secondary | ICD-10-CM | POA: Diagnosis present

## 2016-12-23 DIAGNOSIS — Y92009 Unspecified place in unspecified non-institutional (private) residence as the place of occurrence of the external cause: Secondary | ICD-10-CM

## 2016-12-23 DIAGNOSIS — E785 Hyperlipidemia, unspecified: Secondary | ICD-10-CM | POA: Diagnosis not present

## 2016-12-23 DIAGNOSIS — I472 Ventricular tachycardia: Secondary | ICD-10-CM | POA: Diagnosis not present

## 2016-12-23 DIAGNOSIS — I2510 Atherosclerotic heart disease of native coronary artery without angina pectoris: Secondary | ICD-10-CM | POA: Diagnosis present

## 2016-12-23 DIAGNOSIS — I951 Orthostatic hypotension: Secondary | ICD-10-CM | POA: Diagnosis not present

## 2016-12-23 DIAGNOSIS — I5023 Acute on chronic systolic (congestive) heart failure: Secondary | ICD-10-CM | POA: Diagnosis not present

## 2016-12-23 DIAGNOSIS — I5042 Chronic combined systolic (congestive) and diastolic (congestive) heart failure: Secondary | ICD-10-CM | POA: Diagnosis present

## 2016-12-23 DIAGNOSIS — I130 Hypertensive heart and chronic kidney disease with heart failure and stage 1 through stage 4 chronic kidney disease, or unspecified chronic kidney disease: Secondary | ICD-10-CM | POA: Diagnosis present

## 2016-12-23 DIAGNOSIS — I10 Essential (primary) hypertension: Secondary | ICD-10-CM | POA: Diagnosis present

## 2016-12-23 DIAGNOSIS — N183 Chronic kidney disease, stage 3 (moderate): Secondary | ICD-10-CM | POA: Diagnosis present

## 2016-12-23 DIAGNOSIS — Z800 Family history of malignant neoplasm of digestive organs: Secondary | ICD-10-CM

## 2016-12-23 DIAGNOSIS — I4589 Other specified conduction disorders: Secondary | ICD-10-CM | POA: Diagnosis present

## 2016-12-23 DIAGNOSIS — Z8249 Family history of ischemic heart disease and other diseases of the circulatory system: Secondary | ICD-10-CM | POA: Diagnosis not present

## 2016-12-23 DIAGNOSIS — Z886 Allergy status to analgesic agent status: Secondary | ICD-10-CM

## 2016-12-23 DIAGNOSIS — T447X5A Adverse effect of beta-adrenoreceptor antagonists, initial encounter: Secondary | ICD-10-CM | POA: Diagnosis not present

## 2016-12-23 DIAGNOSIS — Z933 Colostomy status: Secondary | ICD-10-CM | POA: Diagnosis not present

## 2016-12-23 HISTORY — DX: Other cardiomyopathies: I42.8

## 2016-12-23 HISTORY — DX: Orthostatic hypotension: I95.1

## 2016-12-23 HISTORY — DX: Acute myocardial infarction, unspecified: I21.9

## 2016-12-23 HISTORY — DX: Chronic combined systolic (congestive) and diastolic (congestive) heart failure: I50.42

## 2016-12-23 HISTORY — DX: Heart failure, unspecified: I50.9

## 2016-12-23 HISTORY — DX: Ventricular premature depolarization: I49.3

## 2016-12-23 MED ORDER — PANTOPRAZOLE SODIUM 40 MG PO TBEC
40.0000 mg | DELAYED_RELEASE_TABLET | Freq: Every day | ORAL | Status: DC
  Administered 2016-12-24 – 2016-12-27 (×4): 40 mg via ORAL
  Filled 2016-12-23 (×4): qty 1

## 2016-12-23 MED ORDER — SODIUM CHLORIDE 0.9% FLUSH: 3.0000 mL | INTRAVENOUS | Status: DC | PRN

## 2016-12-23 MED ORDER — ASPIRIN EC 81 MG PO TBEC
81.0000 mg | DELAYED_RELEASE_TABLET | Freq: Every day | ORAL | Status: DC
  Administered 2016-12-24 – 2016-12-27 (×4): 81 mg via ORAL
  Filled 2016-12-23 (×4): qty 1

## 2016-12-23 MED ORDER — ENOXAPARIN SODIUM 40 MG/0.4ML ~~LOC~~ SOLN
40.0000 mg | SUBCUTANEOUS | Status: DC
  Administered 2016-12-23 – 2016-12-26 (×4): 40 mg via SUBCUTANEOUS
  Filled 2016-12-23 (×4): qty 0.4

## 2016-12-23 MED ORDER — SODIUM CHLORIDE 0.9% FLUSH
3.0000 mL | Freq: Two times a day (BID) | INTRAVENOUS | Status: DC
  Administered 2016-12-23 – 2016-12-26 (×6): 3 mL via INTRAVENOUS

## 2016-12-23 MED ORDER — ROSUVASTATIN CALCIUM 10 MG PO TABS
20.0000 mg | ORAL_TABLET | Freq: Every day | ORAL | Status: DC
  Administered 2016-12-24 – 2016-12-26 (×3): 20 mg via ORAL
  Filled 2016-12-23 (×3): qty 2

## 2016-12-23 MED ORDER — SODIUM CHLORIDE 0.9 % IV SOLN: 250.0000 mL | INTRAVENOUS | Status: DC | PRN

## 2016-12-23 MED ORDER — SODIUM CHLORIDE 0.9% FLUSH
3.0000 mL | Freq: Two times a day (BID) | INTRAVENOUS | Status: DC
  Administered 2016-12-23 – 2016-12-24 (×2): 3 mL via INTRAVENOUS

## 2016-12-23 MED ORDER — CARVEDILOL 6.25 MG PO TABS
6.2500 mg | ORAL_TABLET | Freq: Two times a day (BID) | ORAL | Status: DC
  Administered 2016-12-23: 6.25 mg via ORAL
  Filled 2016-12-23: qty 1

## 2016-12-23 NOTE — H&P (Signed)
History & Physical    Patient ID: David Underwood MRN: [id number], DOB/AGE: 1950-11-11   Admit date: 12/23/2016   Primary Physician: [facility] Primary Cardiologist: Dr. Radford Pax  Patient Profile    66 y.o. Male with history of GERD, colon CA s/p colostomy, CHF and hypertension who recently presented to the [facility] for evaluation of epigastric pain associated with N/V and diaphoresis. He also complained of DOE and orthopnea. His troponin was mildly elevated and BNP was 445. EKG showed sinus bradycardia with nonspecific IVCD and diffuse T wave abnormality c/w inferolateral ischemia and anterior infarct. He was transferred to [facility]. A cardiac catheterization was done showing non-obstructive CAD and severe LV dysfunction. An echocardiogram showed LV EF 20-25% with diffuse hypokinesis and grade 2 DD. He was discharged home on BB, statin, lasix and Entresto. This morning the patient had a near syncopal episode.  History of Present Illness    David Underwood is a 66 y.o. male with past medical history of GERD, colon CA s/p colostomy, hypertension and CHF with EF recently 20-25%. He was just discharged from the hospital yesterday. This morning he walked down the hall to have breakfast in the kitchen. He felt very hungry, then after sitting down at the table he felt dizzy, nausea, shortness of breath, neck pain, epigastric pain and then very diaphoretic. He had to close his eyes and slumped onto the table. He could hear people taking, but could not respond. His wife called 2 and he was taken to [facility]. He was given IV fluids in the ambulance and once stabilized at [facility] he was brought back here since he was just discharged.   Currently he has mild vague epigastric discomfort similar to his presentation for the previous hospitalization. Denies any chest pain. No dyspnea, palpitation or lightheadedness at present. There was a question of medication but meds reviewed with  wife and appears that she was giving him his proper meds.   Past Medical History    Past Medical History:  Diagnosis Date  . Back pain   . Colon cancer [facility])    s/p colostomy  . GERD (gastroesophageal reflux disease)    with associated esophagitis on PPI  . Hyperlipidemia   . Hypertension     Past Surgical History:  Procedure Laterality Date  . COLON SURGERY    . LEFT HEART CATH AND CORONARY ANGIOGRAPHY N/A 12/21/2016   Procedure: Left Heart Cath and Coronary Angiography;  Surgeon: Martinique, Peter M, MD;  Location: [facility];  Service: Cardiovascular;  Laterality: N/A;     Allergies  Allergies  Allergen Reactions  . Aspirin     "stomach irritation", GI bleeding, also mentions colon CA     Home Medications    Prior to Admission medications   Medication Sig Start Date End Date Taking? Authorizing Provider  aspirin 81 MG EC tablet Take 1 tablet (81 mg total) by mouth daily. 12/23/16   Delos Haring, PA-C  carvedilol (COREG) 6.25 MG tablet Take 6.25 mg by mouth 2 (two) times daily. 12/17/16   [provider]  furosemide (LASIX) 40 MG tablet Take 1 tablet (40 mg total) by mouth daily. 12/22/16   Delos Haring, PA-C  nitroGLYCERIN (NITROSTAT) 0.4 MG SL tablet Place 1 tablet (0.4 mg total) under the tongue every 5 (five) minutes x 3 doses as needed for chest pain. 12/22/16   Delos Haring, PA-C  pantoprazole (PROTONIX) 40 MG tablet Take 1 tablet (40 mg total) by  mouth daily. 12/23/16   Delos Haring, PA-C  potassium chloride SA (K-DUR,KLOR-CON) 20 MEQ tablet Take 1 tablet (20 mEq total) by mouth daily. 12/23/16   Delos Haring, PA-C  rosuvastatin (CRESTOR) 20 MG tablet Take 20 mg by mouth daily. 12/17/16   [provider]  sacubitril-valsartan (ENTRESTO) 24-26 MG Take 1 tablet by mouth 2 (two) times daily. 12/22/16   Delos Haring, PA-C    Family History    Family History  Problem Relation Age of Onset  . Colon cancer Mother   . Deep vein  thrombosis Father   . Heart disease Brother   . Heart attack Brother      Social History    Social History   Social History  . Marital status: Married    Spouse name: N/A  . Number of children: N/A  . Years of education: N/A   Occupational History  . Not on file.   Social History Main Topics  . Smoking status: Never Smoker  . Smokeless tobacco: Never Used  . Alcohol use Not on file  . Drug use: Unknown  . Sexual activity: No   Other Topics Concern  . Not on file   Social History Narrative  . No narrative on file     Review of Systems    See HPI. All other systems reviewed and are otherwise negative except as noted above.  Physical Exam    There were no vitals taken for this visit.  General: Well developed, well nourished,male in no acute distress. Head: Normocephalic, atraumatic, sclera non-icteric, no xanthomas, nares are without discharge. Dentition:  Neck: No carotid bruits. JVD not elevated.  Lungs: Respirations regular and unlabored, without wheezes or rales.  Heart: Regular rate and rhythm. No S3 or S4.  No murmur, no rubs, or gallops appreciated. Abdomen: Soft, non-tender, non-distended with normoactive bowel sounds. No hepatomegaly. No rebound/guarding. No obvious abdominal masses. Msk:  Strength and tone appear normal for age. No joint deformities or effusions. Extremities: No clubbing or cyanosis. No edema.  Distal pedal pulses are 2+ bilaterally. Neuro: Alert and oriented X 3. Moves all extremities spontaneously. No focal deficits noted. Psych:  Responds to questions appropriately with a normal affect. Skin: No rashes or lesions noted  Labs    Troponin (Point of Care Test) No results for input(s): TROPIPOC in the last 72 hours.  Recent Labs  12/20/16 1809 12/20/16 2223  TROPONINI 0.30* 0.37*   Lab Results  Component Value Date   WBC 10.2 12/22/2016   HGB 16.6 12/22/2016   HCT 48.3 12/22/2016   MCV 91.5 12/22/2016   PLT 190 12/22/2016      Recent Labs Lab 12/20/16 0454  12/22/16 0913  NA 138  < > 136  K 3.6  < > 3.5  CL 107  < > 103  CO2 24  < > 23  BUN 10  < > 17  CREATININE 1.39*  < > 1.59*  CALCIUM 9.0  < > 9.3  PROT 6.2*  --   --   BILITOT 1.1  --   --   ALKPHOS 50  --   --   ALT 20  --   --   AST 20  --   --   GLUCOSE 116*  < > 155*  < > = values in this interval not displayed. Lab Results  Component Value Date   CHOL 168 12/20/2016   HDL 48 12/20/2016   LDLCALC 109 (H) 12/20/2016   TRIG 55 12/20/2016  No results found for: DDIMER   B Natriuretic Peptide  Date/Time Value Ref Range Status  12/19/2016 10:58 PM 445.3 (H) 0.0 - 100.0 pg/mL Final   No results found for: PROBNP No results for input(s): INR in the last 72 hours.    Radiology Studies    Dg Chest 2 View  Result Date: 12/19/2016 CLINICAL DATA:  Acute onset of epigastric abdominal pain and pressure. Indigestion. Initial encounter. EXAM: CHEST  2 VIEW COMPARISON:  None. FINDINGS: The lungs are well-aerated. Pulmonary vascularity is at the upper limits of normal. There is no evidence of focal opacification, pleural effusion or pneumothorax. The heart is borderline normal in size. No acute osseous abnormalities are seen. IMPRESSION: No acute cardiopulmonary process seen. Electronically Signed   By: Garald Balding M.D.   On: 12/19/2016 23:18    EKG & Cardiac Imaging    EKG: Pending  ECHOCARDIOGRAM: 12/22/2016  Study Conclusions  - Left ventricle: The cavity size was mildly dilated. Wall thickness was normal. Systolic function was severely reduced. The estimated ejection fraction was in the range of 20% to 25%. Diffuse hypokinesis. Features are consistent with a pseudonormal left ventricular filling pattern, with concomitant abnormal relaxation and increased filling pressure (grade 2 diastolic dysfunction). - Aortic valve: There was mild regurgitation. - Mitral valve: There was mild to moderate regurgitation. - Right  atrium: The atrium was mildly dilated. - Tricuspid valve: There was moderate regurgitation. - Pulmonary arteries: Systolic pressure was moderately increased. PA peak pressure: 40 mm Hg (S). __________________________________________________________________________________________  Left Heart Cath and Coronary Angiography 12/21/16  Conclusion    Mid RCA lesion, 30 %stenosed.  RPDA-2 lesion, 50 %stenosed.  RPDA-1 lesion, 50 %stenosed.  Prox LAD to Mid LAD lesion, 30 %stenosed.  Ost 2nd Mrg to 2nd Mrg lesion, 15 %stenosed.  There is severe left ventricular systolic dysfunction.  LV end diastolic pressure is normal.  The left ventricular ejection fraction is less than 25% by visual estimate.  1. Nonobstructive CAD 2. Severe LV dysfunction 3. Normal LV EDP  Plan: Medical management.     Assessment & Plan    Near Syncope -Pt recently discharged yesterday after cath showed non-obstructive CAD but severely decreased EF of 20-25%. He was diuresed and discharged home on carvedilol, Entresto and Lasix. -This am had near syncopal episode likely related to volume depletion. Labs at [facility] showed slight increase in SCr to 1.65 and BUN 21.  -Will hold lasix and Entresto for now. Will reassess tomorrow. Consider switching to a low dose ARB and work back up to [facility] later as an out-patient.  -Will check orthostatic VS and labs. -Wife has some trouble with minding medications. Will consult care management for assistance. Daughter is going to buy a weekly pill box that she can fill for the pt.   Non-ischemic cardiomyopathy -Plan as noted above   SignedDaune Perch, NP-C 12/23/2016, 5:35 PM Pager: [phone]  Attending Note:   The patient was seen and examined.  Agree with assessment and plan as noted above.  Changes made to the above note as needed.  Patient seen and independently examined with Pecolia Ades, NP.   We discussed all aspects of the encounter. I agree  with the assessment and plan as stated above.  1.  Near syncope / Orthostatic hypotension: Kayleb presents today with an episode of near syncope that occurred this am at 8 AM.  He had just taken his meds ( coreg, entresto, lasix) and was going to eat breakfast He felt  swimmy headed became very dizzy.   Never lost consciousness.   Was taken to [facility] and then was transferred here.  He was just DC'd from our hospital yesterday . Denies any cp or dyspnea.  I think that we were a bit too aggressive in our medication titration for his CHF.   I suspect that he is volume depleted (creatinine is slightly higher)  Will hold lasix and entresto for now Tomorrow will hold based on his BP  Eventually we will need to add back the lasix . I suggest that we start Losartan 50 mg once his BP returns to normal .  Will gradually titrate up  (including possibly starting Entresto in place of Losartan at some point )  He will need to stay for several days so that we can ensure that he is stable.    I have spent a total of 40 minutes with patient reviewing hospital  notes , telemetry, EKGs, labs and examining patient as well as establishing an assessment and plan that was discussed with the patient. > 50% of time was spent in direct patient care.    Thayer Headings, Brooke Bonito., MD, [facility] 12/23/2016, 6:45 PM [address],  [place] Pager [phone]

## 2016-12-24 ENCOUNTER — Encounter (HOSPITAL_COMMUNITY): Payer: Self-pay | Admitting: Physician Assistant

## 2016-12-24 DIAGNOSIS — R55 Syncope and collapse: Secondary | ICD-10-CM

## 2016-12-24 DIAGNOSIS — I5042 Chronic combined systolic (congestive) and diastolic (congestive) heart failure: Secondary | ICD-10-CM | POA: Diagnosis not present

## 2016-12-24 DIAGNOSIS — I951 Orthostatic hypotension: Secondary | ICD-10-CM

## 2016-12-24 DIAGNOSIS — I428 Other cardiomyopathies: Secondary | ICD-10-CM

## 2016-12-24 HISTORY — DX: Orthostatic hypotension: I95.1

## 2016-12-24 HISTORY — DX: Chronic combined systolic (congestive) and diastolic (congestive) heart failure: I50.42

## 2016-12-24 HISTORY — DX: Other cardiomyopathies: I42.8

## 2016-12-24 LAB — BASIC METABOLIC PANEL
ANION GAP: 11 (ref 5–15)
BUN: 26 mg/dL — ABNORMAL HIGH (ref 6–20)
CALCIUM: 8.8 mg/dL — AB (ref 8.9–10.3)
CO2: 20 mmol/L — ABNORMAL LOW (ref 22–32)
Chloride: 103 mmol/L (ref 101–111)
Creatinine, Ser: 1.56 mg/dL — ABNORMAL HIGH (ref 0.61–1.24)
GFR, EST AFRICAN AMERICAN: 52 mL/min — AB (ref >60)
GFR, EST NON AFRICAN AMERICAN: 45 mL/min — AB (ref >60)
Glucose, Bld: 133 mg/dL — ABNORMAL HIGH (ref 65–99)
POTASSIUM: 4.6 mmol/L (ref 3.5–5.1)
SODIUM: 134 mmol/L — AB (ref 135–145)

## 2016-12-24 MED ORDER — MAGNESIUM OXIDE 400 (241.3 MG) MG PO TABS
400.0000 mg | ORAL_TABLET | Freq: Once | ORAL | Status: AC
  Administered 2016-12-24: 400 mg via ORAL
  Filled 2016-12-24: qty 1

## 2016-12-24 MED ORDER — CARVEDILOL 3.125 MG PO TABS
3.1250 mg | ORAL_TABLET | Freq: Two times a day (BID) | ORAL | Status: DC
  Administered 2016-12-24 – 2016-12-25 (×2): 3.125 mg via ORAL
  Filled 2016-12-24 (×2): qty 1

## 2016-12-24 MED ORDER — POTASSIUM CHLORIDE CRYS ER 20 MEQ PO TBCR
20.0000 meq | EXTENDED_RELEASE_TABLET | Freq: Once | ORAL | Status: AC
Start: 2016-12-24 — End: 2016-12-24
  Administered 2016-12-24: 20 meq via ORAL
  Filled 2016-12-24: qty 1

## 2016-12-24 NOTE — Care Management Note (Signed)
Case Management Note  Patient Details  Name: David Underwood MRN: [id number] Date of Birth: October 01, 1950  Subjective/Objective:                 Consult placed for medication assistance. Per patient's wife it was originally though that she had mis dosed home medications and it led to his syncope. She stated that with closer review she was told that she had given all medications correctly at home. She did endorse originally having difficulty reading bottle labels and matching them up to the AVS CM reviewed home medication bottles with wife and circled names of medications on them for her and helped her identify what they were. Suggested that she ask pharmacy to print lables with larger font so they would be easier to read and have pharmacy tech circle the name of the medications prior to taking them home. She was grateful for this suggestion as she was not aware she could ask for this kind of help at the pharmacy. Patient DC'd 2 days ago with NSTEMI and in obs for syncope.      Action/Plan:  PLEASE PRINT AVS WITH LARGE FONT.  CM will continue to follow for DC needs.  Expected Discharge Date:                  Expected Discharge Plan:  Home/Self Care  In-House Referral:     Discharge planning Services  CM Consult  Post Acute Care Choice:    Choice offered to:     DME Arranged:    DME Agency:     HH Arranged:    HH Agency:     Status of Service:  In process, will continue to follow  If discussed at Long Length of Stay Meetings, dates discussed:    Additional Comments:  Carles Collet, RN 12/24/2016, 12:00 PM

## 2016-12-24 NOTE — Care Management Obs Status (Signed)
[place] [place] NOTIFICATION   Patient Details  Name: Quadarius Henton MRN: [id number] Date of Birth: 11/18/1950   Medicare Observation Status Notification Given:  Yes    Carles Collet, RN 12/24/2016, 12:00 PM

## 2016-12-24 NOTE — Progress Notes (Signed)
Progress Note  Patient Name: David Underwood Date of Encounter: 12/24/2016  Primary Cardiologist: Dr Radford Pax  Patient Profile     66 y.o. male w/ hx GERD, colon CA s/p colostomy and HTN, d/c 05/30 after admit for epigastric pain>>CHF w/ EF 20-25%>>Entresto, cath w/ non-obs dz. Admitted 05/31 w/ near-syncope, ?volume depletion.  Subjective   Pt light-headed, seeing spots. He had eaten a good breakfast, but had been up getting cleaned up and brushing his teeth. He became a little light-headed, started seeing spots, got hot and sat down. Then his neck got sweaty. Pt assisted back to bed.  No CP, SOB. No palpitations, not aware of heart skips.  Inpatient Medications    Scheduled Meds: . aspirin EC  81 mg Oral Daily  . carvedilol  6.25 mg Oral BID  . enoxaparin (LOVENOX) injection  40 mg Subcutaneous Q24H  . pantoprazole  40 mg Oral Daily  . rosuvastatin  20 mg Oral QHS  . sodium chloride flush  3 mL Intravenous Q12H  . sodium chloride flush  3 mL Intravenous Q12H   Continuous Infusions: . sodium chloride     PRN Meds: sodium chloride, sodium chloride flush   Vital Signs    Vitals:   12/23/16 2100 12/24/16 0346 12/24/16 0900 12/24/16 0902  BP:  109/66 90/62 101/70  Pulse:  65 73   Resp:  17    Temp:  97.4 F (36.3 C)    TempSrc:  Oral    SpO2:  98% 98%   Weight:  207 lb 6.4 oz (94.1 kg)    Height: 5\' 11"  (1.803 m)       Intake/Output Summary (Last 24 hours) at 12/24/16 1856 Last data filed at 12/24/16 3149  Gross per 24 hour  Intake                0 ml  Output              200 ml  Net             -200 ml   Filed Weights   12/24/16 0346  Weight: 207 lb 6.4 oz (94.1 kg)    Telemetry    SR, PVCs, occ pairs and bigeminy - Personally Reviewed  ECG    n/a - Personally Reviewed  Physical Exam   General: Well developed, well nourished, male appearing in no acute distress. Head: Normocephalic, atraumatic.  Neck: Supple without bruits, JVD not  elevated. Lungs:  Resp regular and unlabored, CTA bilaterally but poor effort, weak Heart: RRR, S1, S2, no S3, S4, or murmur; no rub. Abdomen: Soft, non-tender, non-distended with normoactive bowel sounds. No hepatomegaly. No rebound/guarding. No obvious abdominal masses. Extremities: No clubbing, cyanosis, no edema. Distal pedal pulses are 2+ bilaterally. Neuro: Almost alert, and oriented X 3. Moves all extremities spontaneously.  Labs    Hematology  Recent Labs Lab 12/21/16 0639 12/21/16 1559 12/22/16 0423  WBC 10.3 8.2 10.2  RBC 5.04 5.00 5.28  HGB 15.9 15.4 16.6  HCT 45.8 45.7 48.3  MCV 90.9 91.4 91.5  MCH 31.5 30.8 31.4  MCHC 34.7 33.7 34.4  RDW 12.7 12.7 12.9  PLT 179 182 190    Chemistry  Recent Labs Lab 12/19/16 2258 12/20/16 0454 12/21/16 0928 12/21/16 1559 12/22/16 0913 12/24/16 0241  NA 140 138 136  --  136 134*  K 3.6 3.6 3.5  --  3.5 4.6  CL 109 107 103  --  103 103  CO2 24 24  23  --  23 20*  GLUCOSE 132* 116* 98  --  155* 133*  BUN 12 10 11   --  17 26*  CREATININE 1.43* 1.39* 1.42* 1.50* 1.59* 1.56*  CALCIUM 9.5 9.0 9.2  --  9.3 8.8*  PROT 7.5 6.2*  --   --   --   --   ALBUMIN 4.1 3.4*  --   --   --   --   AST 24 20  --   --   --   --   ALT 24 20  --   --   --   --   ALKPHOS 59 50  --   --   --   --   BILITOT 0.9 1.1  --   --   --   --   GFRNONAA 50* 52* 50* 47* 44* 45*  GFRAA 58* 60* 58* 55* 51* 52*  ANIONGAP 7 7 10   --  10 11     Radiology    No results found.   Cardiac Studies   None this admit  Patient Profile     66 y.o. male w/ hx GERD, colon CA s/p colostomy and HTN, d/c 05/30 after admit for epigastric pain>>CHF w/ EF 20-25%>>Entresto, cath w/ non-obs dz. Admitted 05/31 w/ near-syncope, ?volume depletion.   Assessment & Plan    Principal Problem:   Orthostatic hypotension - orthostatic VS were positive 05/31, recheck  - if still positive, consider gentle IV hydration since sodium a little low - Lasix 40 mg qd is on  hold  Active Problems:   Syncope and collapse - see above     Chronic combined systolic and diastolic heart failure (HCC) - volume status good by exam - not currently on Lasix - follow daily weights - d/c weight 05/30 same as admit weight 05/31    NICM (nonischemic cardiomyopathy) ([place]) - was d/c'd on Entresto, BB, Lasix  - on Coreg 6.25 bid currently - BP low-nl and pt symptomatic, will decrease to 3.125 mg bid and hold am dose.     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   Augusto Garbe 9:18 AM 12/24/2016 Pager: [phone]   Attending Note:   The patient was seen and examined.  Agree with assessment and plan as noted above.  Changes made to the above note as needed.  Patient seen and independently examined with Rosaria Ferries, PA.   We discussed all aspects of the encounter. I agree with the assessment and plan as stated above.  1.  Orthostatic hypotension:  He had presyncope yesterday am after he had taken his am meds but had not had anything to eat or drink I suspect he was a little volume depleted. Have held lasix and entresto for now Will allow him to re-equilibrate and hopefull be able to start lasix tomorrow ( and possibly Losartan at a low dose )  He may need to wait and start entresto at a later time  2.  Acute on chronic combined systolic and diastolic CHF:   No significant cad by cath Continue with medical therapy  3.  PVCs :  Seem to be slightly better this am  .  4. Chronic renal insufficiency :  With an slight increase over the past several days.   Possibly due to the contrast load with cath    I have spent a total of 40 minutes with patient reviewing hospital  notes , telemetry, EKGs, labs and examining  patient as well as establishing an assessment and plan that was discussed with the patient. > 50% of time was spent in direct patient care.    Thayer Headings, Brooke Bonito., MD, [address] [facility] 12/24/2016, 12:51 PM [address]  [place] Pager [phone]

## 2016-12-25 DIAGNOSIS — I428 Other cardiomyopathies: Secondary | ICD-10-CM

## 2016-12-25 DIAGNOSIS — I10 Essential (primary) hypertension: Secondary | ICD-10-CM | POA: Diagnosis not present

## 2016-12-25 DIAGNOSIS — Z7982 Long term (current) use of aspirin: Secondary | ICD-10-CM | POA: Diagnosis not present

## 2016-12-25 DIAGNOSIS — Z8249 Family history of ischemic heart disease and other diseases of the circulatory system: Secondary | ICD-10-CM | POA: Diagnosis not present

## 2016-12-25 DIAGNOSIS — E869 Volume depletion, unspecified: Secondary | ICD-10-CM | POA: Diagnosis present

## 2016-12-25 DIAGNOSIS — I951 Orthostatic hypotension: Secondary | ICD-10-CM | POA: Diagnosis present

## 2016-12-25 DIAGNOSIS — I493 Ventricular premature depolarization: Secondary | ICD-10-CM | POA: Diagnosis present

## 2016-12-25 DIAGNOSIS — E785 Hyperlipidemia, unspecified: Secondary | ICD-10-CM | POA: Diagnosis present

## 2016-12-25 DIAGNOSIS — Z933 Colostomy status: Secondary | ICD-10-CM | POA: Diagnosis not present

## 2016-12-25 DIAGNOSIS — Z85038 Personal history of other malignant neoplasm of large intestine: Secondary | ICD-10-CM | POA: Diagnosis not present

## 2016-12-25 DIAGNOSIS — Z886 Allergy status to analgesic agent status: Secondary | ICD-10-CM | POA: Diagnosis not present

## 2016-12-25 DIAGNOSIS — Y92009 Unspecified place in unspecified non-institutional (private) residence as the place of occurrence of the external cause: Secondary | ICD-10-CM | POA: Diagnosis not present

## 2016-12-25 DIAGNOSIS — K219 Gastro-esophageal reflux disease without esophagitis: Secondary | ICD-10-CM | POA: Diagnosis present

## 2016-12-25 DIAGNOSIS — I4589 Other specified conduction disorders: Secondary | ICD-10-CM | POA: Diagnosis present

## 2016-12-25 DIAGNOSIS — N183 Chronic kidney disease, stage 3 (moderate): Secondary | ICD-10-CM | POA: Diagnosis present

## 2016-12-25 DIAGNOSIS — Z800 Family history of malignant neoplasm of digestive organs: Secondary | ICD-10-CM | POA: Diagnosis not present

## 2016-12-25 DIAGNOSIS — I130 Hypertensive heart and chronic kidney disease with heart failure and stage 1 through stage 4 chronic kidney disease, or unspecified chronic kidney disease: Secondary | ICD-10-CM | POA: Diagnosis present

## 2016-12-25 DIAGNOSIS — R55 Syncope and collapse: Secondary | ICD-10-CM | POA: Diagnosis present

## 2016-12-25 DIAGNOSIS — I5042 Chronic combined systolic (congestive) and diastolic (congestive) heart failure: Secondary | ICD-10-CM | POA: Diagnosis present

## 2016-12-25 DIAGNOSIS — I2510 Atherosclerotic heart disease of native coronary artery without angina pectoris: Secondary | ICD-10-CM | POA: Diagnosis present

## 2016-12-25 DIAGNOSIS — I472 Ventricular tachycardia: Secondary | ICD-10-CM | POA: Diagnosis present

## 2016-12-25 DIAGNOSIS — T447X5A Adverse effect of beta-adrenoreceptor antagonists, initial encounter: Secondary | ICD-10-CM | POA: Diagnosis present

## 2016-12-25 LAB — BASIC METABOLIC PANEL
Anion gap: 9 (ref 5–15)
BUN: 21 mg/dL — ABNORMAL HIGH (ref 6–20)
CALCIUM: 8.9 mg/dL (ref 8.9–10.3)
CHLORIDE: 104 mmol/L (ref 101–111)
CO2: 22 mmol/L (ref 22–32)
Creatinine, Ser: 1.43 mg/dL — ABNORMAL HIGH (ref 0.61–1.24)
GFR calc Af Amer: 58 mL/min — ABNORMAL LOW (ref >60)
GFR calc non Af Amer: 50 mL/min — ABNORMAL LOW (ref >60)
Glucose, Bld: 98 mg/dL (ref 65–99)
Potassium: 3.6 mmol/L (ref 3.5–5.1)
SODIUM: 135 mmol/L (ref 135–145)

## 2016-12-25 LAB — MAGNESIUM: MAGNESIUM: 2.1 mg/dL (ref 1.7–2.4)

## 2016-12-25 NOTE — Progress Notes (Addendum)
Progress Note  Patient Name: David Underwood Date of Encounter: 12/25/2016  Primary Cardiologist: Dr Radford Pax  Patient Profile     66 y.o. male w/ hx GERD, colon CA s/p colostomy and HTN, d/c 05/30 after admit for epigastric pain>>CHF w/ EF 20-25%>>Entresto, cath w/ non-obs dz. Admitted 05/31 w/ near-syncope, ?volume depletion.  Subjective    No CP, SOB. No palpitations, not aware of heart skips.  Markedly orthostatic on exam today with standing.  BP dropped from 119/96mmHg lying with HR 64 to 66/50mmhg upon immediate standing with HR 37bpm.  Repeat BP after standing for 3 minutes was 107/66mmHg with HR 91bpm.    Inpatient Medications    Scheduled Meds: . aspirin EC  81 mg Oral Daily  . enoxaparin (LOVENOX) injection  40 mg Subcutaneous Q24H  . pantoprazole  40 mg Oral Daily  . rosuvastatin  20 mg Oral QHS  . sodium chloride flush  3 mL Intravenous Q12H  . sodium chloride flush  3 mL Intravenous Q12H   Continuous Infusions: . sodium chloride     PRN Meds: sodium chloride, sodium chloride flush   Vital Signs    Vitals:   12/24/16 0902 12/24/16 1251 12/24/16 1953 12/25/16 0433  BP: 101/70 98/69 107/63 113/64  Pulse:  61 63 64  Resp:  18 18 18   Temp:  98.3 F (36.8 C) 98 F (36.7 C) 98.2 F (36.8 C)  TempSrc:  Oral Oral Oral  SpO2:  98% 96% 98%  Weight:    208 lb 8.9 oz (94.6 kg)  Height:        Intake/Output Summary (Last 24 hours) at 12/25/16 1231 Last data filed at 12/25/16 0746  Gross per 24 hour  Intake              480 ml  Output              500 ml  Net              -20 ml   Filed Weights   12/24/16 0346 12/25/16 0433  Weight: 207 lb 6.4 oz (94.1 kg) 208 lb 8.9 oz (94.6 kg)    Telemetry    SR, PVCs, occ pairs and bigeminy - Personally Reviewed  ECG    No EKG to review- Personally Reviewed  Physical Exam   Currently on commode so exam not performed  Labs    Hematology  Recent Labs Lab 12/21/16 0639 12/21/16 1559 12/22/16 0423    WBC 10.3 8.2 10.2  RBC 5.04 5.00 5.28  HGB 15.9 15.4 16.6  HCT 45.8 45.7 48.3  MCV 90.9 91.4 91.5  MCH 31.5 30.8 31.4  MCHC 34.7 33.7 34.4  RDW 12.7 12.7 12.9  PLT 179 182 190    Chemistry  Recent Labs Lab 12/19/16 2258 12/20/16 0454  12/22/16 0913 12/24/16 0241 12/25/16 0422  NA 140 138  < > 136 134* 135  K 3.6 3.6  < > 3.5 4.6 3.6  CL 109 107  < > 103 103 104  CO2 24 24  < > 23 20* 22  GLUCOSE 132* 116*  < > 155* 133* 98  BUN 12 10  < > 17 26* 21*  CREATININE 1.43* 1.39*  < > 1.59* 1.56* 1.43*  CALCIUM 9.5 9.0  < > 9.3 8.8* 8.9  PROT 7.5 6.2*  --   --   --   --   ALBUMIN 4.1 3.4*  --   --   --   --  AST 24 20  --   --   --   --   ALT 24 20  --   --   --   --   ALKPHOS 59 50  --   --   --   --   BILITOT 0.9 1.1  --   --   --   --   GFRNONAA 50* 52*  < > 44* 45* 50*  GFRAA 58* 60*  < > 51* 52* 58*  ANIONGAP 7 7  < > 10 11 9   < > = values in this interval not displayed.   Radiology    No results found.   Cardiac Studies   None this admit  Patient Profile     66 y.o. male w/ hx GERD, colon CA s/p colostomy and HTN, d/c 05/30 after admit for epigastric pain>>CHF w/ EF 20-25%>>Entresto, cath w/ non-obs dz. Admitted 05/31 w/ near-syncope, ?volume depletion.   Assessment & Plan   1.  Orthostatic hypotension - orthostatic VS were positive 05/31.   Markedly orthostatic on exam today with standing.  BP dropped from 119/27mmHg lying with HR 64 to 66/19mmhg upon immediate standing with HR 37bpm.  Repeat BP after standing for 3 minutes was 107/17mmHg with HR 91bpm.  He appears to have a component of chronotropic incompetance likely due to Carvedilol. - Coreg has been stopped - Creatinine improved with holding Lasix. If remains orthostatic off BB and diuretics then consider gentle IV hydration   2.  Chronic combined systolic and diastolic heart failure (HCC) - volume status good by exam.  He put out 500cc yesterday and now net neg 200cc. - not currently on Lasix -  follow daily weights - weight up 1lb from yesterday. - d/c weight 05/30 same as admit weight 05/31at 207lbs.  3. NICM (nonischemic cardiomyopathy) ([place]) - was d/c'd on Entresto, BB, Lasix  - Coreg now stopped today due to marked orthostatic hypotension with chronotropic incompetance on exam today  4. PVCs - they are frequent - K+ 3.6 today and mag normal at 2.1 - no NSVT on telemetry, but I am concerned that if PVCs are frequent enough, bigeminy lasts long enough, they may be causing some sx.   SignedFransico Him , PA-C 12:31 PM 12/25/2016 Pager: [phone]

## 2016-12-25 NOTE — Progress Notes (Signed)
N.T. Unable to finish Ortho. V.S. Due to patient felt light headed as Cuff was inflat. Will have 1st shift try later .

## 2016-12-26 LAB — BASIC METABOLIC PANEL
Anion gap: 6 (ref 5–15)
BUN: 19 mg/dL (ref 6–20)
CHLORIDE: 106 mmol/L (ref 101–111)
CO2: 21 mmol/L — ABNORMAL LOW (ref 22–32)
Calcium: 8.9 mg/dL (ref 8.9–10.3)
Creatinine, Ser: 1.28 mg/dL — ABNORMAL HIGH (ref 0.61–1.24)
GFR calc Af Amer: >60 mL/min (ref >60)
GFR calc non Af Amer: 57 mL/min — ABNORMAL LOW (ref >60)
GLUCOSE: 126 mg/dL — AB (ref 65–99)
POTASSIUM: 3.7 mmol/L (ref 3.5–5.1)
Sodium: 133 mmol/L — ABNORMAL LOW (ref 135–145)

## 2016-12-26 NOTE — Plan of Care (Signed)
Problem: Tissue Perfusion: Goal: Risk factors for ineffective tissue perfusion will decrease Outcome: Progressing TED hose and abdominal binder in place

## 2016-12-26 NOTE — Progress Notes (Addendum)
Progress Note  Patient Name: Jonte Shiller Date of Encounter: 12/26/2016  Primary Cardiologist: Dr. Radford Pax  Subjective   Feeling better today  Inpatient Medications    Scheduled Meds: . aspirin EC  81 mg Oral Daily  . enoxaparin (LOVENOX) injection  40 mg Subcutaneous Q24H  . pantoprazole  40 mg Oral Daily  . rosuvastatin  20 mg Oral QHS  . sodium chloride flush  3 mL Intravenous Q12H  . sodium chloride flush  3 mL Intravenous Q12H   Continuous Infusions: . sodium chloride     PRN Meds: sodium chloride, sodium chloride flush   Vital Signs    Vitals:   12/25/16 0433 12/25/16 1512 12/25/16 2015 12/26/16 0605  BP: 113/64 (!) 111/93 111/73 (!) 116/98  Pulse: 64 (!) 50 63 66  Resp: 18  18 18   Temp: 98.2 F (36.8 C) 98 F (36.7 C) 98 F (36.7 C) 98.1 F (36.7 C)  TempSrc: Oral Oral Oral Oral  SpO2: 98% 98% 98% 98%  Weight: 208 lb 8.9 oz (94.6 kg)   208 lb 3.2 oz (94.4 kg)  Height:        Intake/Output Summary (Last 24 hours) at 12/26/16 1130 Last data filed at 12/26/16 2836  Gross per 24 hour  Intake                0 ml  Output              200 ml  Net             -200 ml   Filed Weights   12/24/16 0346 12/25/16 0433 12/26/16 0605  Weight: 207 lb 6.4 oz (94.1 kg) 208 lb 8.9 oz (94.6 kg) 208 lb 3.2 oz (94.4 kg)    Telemetry    NSR with PVC's, couplets and occasional junctional beats - Personally Reviewed  ECG    No new EKG to review - Personally Reviewed  Physical Exam   GEN: No acute distress.   Neck: No JVD Cardiac: RRR, no murmurs, rubs, or gallops.  Respiratory: Clear to auscultation bilaterally. GI: Soft, nontender, non-distended  MS: No edema; No deformity. Neuro:  Nonfocal  Psych: Normal affect   Labs    Chemistry Recent Labs Lab 12/19/16 2258 12/20/16 0454  12/24/16 0241 12/25/16 0422 12/26/16 0229  NA 140 138  < > 134* 135 133*  K 3.6 3.6  < > 4.6 3.6 3.7  CL 109 107  < > 103 104 106  CO2 24 24  < > 20* 22 21*  GLUCOSE  132* 116*  < > 133* 98 126*  BUN 12 10  < > 26* 21* 19  CREATININE 1.43* 1.39*  < > 1.56* 1.43* 1.28*  CALCIUM 9.5 9.0  < > 8.8* 8.9 8.9  PROT 7.5 6.2*  --   --   --   --   ALBUMIN 4.1 3.4*  --   --   --   --   AST 24 20  --   --   --   --   ALT 24 20  --   --   --   --   ALKPHOS 59 50  --   --   --   --   BILITOT 0.9 1.1  --   --   --   --   GFRNONAA 50* 52*  < > 45* 50* 57*  GFRAA 58* 60*  < > 52* 58* >60  ANIONGAP 7 7  < >  11 9 6   < > = values in this interval not displayed.   Hematology Recent Labs Lab 12/21/16 0639 12/21/16 1559 12/22/16 0423  WBC 10.3 8.2 10.2  RBC 5.04 5.00 5.28  HGB 15.9 15.4 16.6  HCT 45.8 45.7 48.3  MCV 90.9 91.4 91.5  MCH 31.5 30.8 31.4  MCHC 34.7 33.7 34.4  RDW 12.7 12.7 12.9  PLT 179 182 190    Cardiac Enzymes Recent Labs Lab 12/20/16 0454 12/20/16 0958 12/20/16 1809 12/20/16 2223  TROPONINI 0.26* 0.27* 0.30* 0.37*   No results for input(s): TROPIPOC in the last 168 hours.   BNP Recent Labs Lab 12/19/16 2258  BNP 445.3*     DDimer No results for input(s): DDIMER in the last 168 hours.   Radiology    No results found.  Cardiac Studies   Cath 12/21/2016 Conclusion     Mid RCA lesion, 30 %stenosed.  RPDA-2 lesion, 50 %stenosed.  RPDA-1 lesion, 50 %stenosed.  Prox LAD to Mid LAD lesion, 30 %stenosed.  Ost 2nd Mrg to 2nd Mrg lesion, 15 %stenosed.  There is severe left ventricular systolic dysfunction.  LV end diastolic pressure is normal.  The left ventricular ejection fraction is less than 25% by visual estimate.   1. Nonobstructive CAD 2. Severe LV dysfunction 3. Normal LV EDP   2D echo 12/20/2016 Study Conclusions  - Left ventricle: The cavity size was mildly dilated. Wall   thickness was normal. Systolic function was severely reduced. The   estimated ejection fraction was in the range of 20% to 25%.   Diffuse hypokinesis. Features are consistent with a pseudonormal   left ventricular filling pattern,  with concomitant abnormal   relaxation and increased filling pressure (grade 2 diastolic   dysfunction). - Aortic valve: There was mild regurgitation. - Mitral valve: There was mild to moderate regurgitation. - Right atrium: The atrium was mildly dilated. - Tricuspid valve: There was moderate regurgitation. - Pulmonary arteries: Systolic pressure was moderately increased.   PA peak pressure: 40 mm Hg (S).    Patient Profile     66 y.o. male w/ hx GERD, colon CA s/p colostomy and HTN, d/c 05/30 after admit for epigastric pain>>CHF w/ EF 20-25%>>Entresto, cath w/ non-obs dz. Admitted 05/31 w/ near-syncope, ?volume depletion.   Assessment & Plan    1.  Orthostatic hypotension - orthostatic VS were positive 05/31.   Markedly orthostatic on exam yesterday with standing.  BP dropped from 119/65mmHg lying with HR 64 to 66/54mmhg upon immediate standing with HR 37bpm.  Repeat BP after standing for 3 minutes was 107/27mmHg with HR 91bpm.  He appears to have a component of chronotropic incompetance likely due to Carvedilol. - Coreg has been stopped - orthostatics pending this am - will make sure this gets done - Creatinine improved with holding Lasix (1.59>1.56>1.43>1.28). If remains orthostatic off BB and diuretics then consider gentle IV hydration  - orthostasis likely due to aggressive Rx with HF meds on recent admission. - will add compression hose and abdominal binder.  May need to consider Proamatine.  Would avoid florinef in setting of CHF.  2.  Chronic combined systolic and diastolic heart failure (HCC) - volume status good by exam.  He put out 400cc yesterday and now net neg 420cc. - not currently on Lasix - follow daily weights - weight the same today at 208lbs - d/c weight 05/30 same as admit weight 05/31at 207lbs. - continue to hold Lasix, Entresto and Coreg  3. NICM (nonischemic cardiomyopathy) (  [place]) - was d/c'd on Entresto, BB, Lasix  - Coreg now stopped today due to  marked orthostatic hypotension with chronotropic incompetance - Entresto and diuretic now on hold as well  4. 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  - will need outpt Holter to assess PVC load - he also has some evidence of junctional rhythm intermittently.      Signed, Fransico Him, MD  12/26/2016, 11:30 AM

## 2016-12-27 ENCOUNTER — Encounter (HOSPITAL_COMMUNITY): Payer: Self-pay | Admitting: Cardiology

## 2016-12-27 ENCOUNTER — Other Ambulatory Visit: Payer: Self-pay | Admitting: Cardiology

## 2016-12-27 DIAGNOSIS — I493 Ventricular premature depolarization: Secondary | ICD-10-CM

## 2016-12-27 DIAGNOSIS — I10 Essential (primary) hypertension: Secondary | ICD-10-CM

## 2016-12-27 DIAGNOSIS — I428 Other cardiomyopathies: Secondary | ICD-10-CM

## 2016-12-27 HISTORY — DX: Ventricular premature depolarization: I49.3

## 2016-12-27 LAB — BASIC METABOLIC PANEL
ANION GAP: 7 (ref 5–15)
BUN: 16 mg/dL (ref 6–20)
CO2: 22 mmol/L (ref 22–32)
Calcium: 9.1 mg/dL (ref 8.9–10.3)
Chloride: 107 mmol/L (ref 101–111)
Creatinine, Ser: 1.38 mg/dL — ABNORMAL HIGH (ref 0.61–1.24)
GFR, EST NON AFRICAN AMERICAN: 52 mL/min — AB (ref >60)
GLUCOSE: 98 mg/dL (ref 65–99)
POTASSIUM: 3.7 mmol/L (ref 3.5–5.1)
Sodium: 136 mmol/L (ref 135–145)

## 2016-12-27 MED ORDER — LOSARTAN POTASSIUM 25 MG PO TABS
12.5000 mg | ORAL_TABLET | Freq: Every day | ORAL | Status: DC
  Administered 2016-12-27: 12.5 mg via ORAL
  Filled 2016-12-27: qty 1

## 2016-12-27 MED ORDER — LOSARTAN POTASSIUM 25 MG PO TABS: 12.5000 mg | ORAL_TABLET | Freq: Every day | ORAL | 6 refills | Status: DC

## 2016-12-27 NOTE — Discharge Instructions (Signed)
Heart healthy low salt diet.    Weigh daily if weight increases by 3 pounds in a day call the office  Keep follow up.

## 2016-12-27 NOTE — Progress Notes (Signed)
Progress Note  Patient Name: David Underwood Date of Encounter: 12/27/2016  Primary Cardiologist: Dr. Radford Pax  Subjective   Feeling well.  Denies lightheadedness or dizziness with ambulation. Denies palpitations.  Inpatient Medications    Scheduled Meds: . aspirin EC  81 mg Oral Daily  . enoxaparin (LOVENOX) injection  40 mg Subcutaneous Q24H  . pantoprazole  40 mg Oral Daily  . rosuvastatin  20 mg Oral QHS  . sodium chloride flush  3 mL Intravenous Q12H  . sodium chloride flush  3 mL Intravenous Q12H   Continuous Infusions: . sodium chloride     PRN Meds: sodium chloride, sodium chloride flush   Vital Signs    Vitals:   12/26/16 0605 12/26/16 1658 12/26/16 1949 12/27/16 0409  BP: (!) 116/98 130/89 (!) 130/92 (!) 129/97  Pulse: 66 64 (!) 57 61  Resp: 18 18 16 16   Temp: 98.1 F (36.7 C) 97.8 F (36.6 C) 98.2 F (36.8 C) 98 F (36.7 C)  TempSrc: Oral Oral Oral Oral  SpO2: 98% 100% 98% 99%  Weight: 94.4 kg (208 lb 3.2 oz)   94.5 kg (208 lb 4.8 oz)  Height:       No intake or output data in the 24 hours ending 12/27/16 1005 Filed Weights   12/25/16 0433 12/26/16 0605 12/27/16 0409  Weight: 94.6 kg (208 lb 8.9 oz) 94.4 kg (208 lb 3.2 oz) 94.5 kg (208 lb 4.8 oz)    Telemetry    Sinus rhythm. Frequent PVCs. NSVT up to 8 beats. - Personally Reviewed  ECG    N/a - Personally Reviewed  Physical Exam   GEN: Well-appearing.  No acute distress.   Neck: No JVD Cardiac: RRR, no murmurs, rubs, or gallops.  Respiratory: Clear to auscultation bilaterally.  No crackles, wheezes or rhonchi. GI: Soft, nontender, non-distended  MS: No deformity. Neuro:  Nonfocal  Psych: Normal affect  Ext: No edema.  Ted hose in place.  Labs    Chemistry Recent Labs Lab 12/25/16 0422 12/26/16 0229 12/27/16 0342  NA 135 133* 136  K 3.6 3.7 3.7  CL 104 106 107  CO2 22 21* 22  GLUCOSE 98 126* 98  BUN 21* 19 16  CREATININE 1.43* 1.28* 1.38*  CALCIUM 8.9 8.9 9.1    GFRNONAA 50* 57* 52*  GFRAA 58* >60 >60  ANIONGAP 9 6 7      Hematology Recent Labs Lab 12/21/16 0639 12/21/16 1559 12/22/16 0423  WBC 10.3 8.2 10.2  RBC 5.04 5.00 5.28  HGB 15.9 15.4 16.6  HCT 45.8 45.7 48.3  MCV 90.9 91.4 91.5  MCH 31.5 30.8 31.4  MCHC 34.7 33.7 34.4  RDW 12.7 12.7 12.9  PLT 179 182 190    Cardiac Enzymes Recent Labs Lab 12/20/16 1809 12/20/16 2223  TROPONINI 0.30* 0.37*   No results for input(s): TROPIPOC in the last 168 hours.   BNPNo results for input(s): BNP, PROBNP in the last 168 hours.   DDimer No results for input(s): DDIMER in the last 168 hours.   Radiology    No results found.  Cardiac Studies   Cath 12/21/2016    Mid RCA lesion, 30 %stenosed.  RPDA-2 lesion, 50 %stenosed.  RPDA-1 lesion, 50 %stenosed.  Prox LAD to Mid LAD lesion, 30 %stenosed.  Ost 2nd Mrg to 2nd Mrg lesion, 15 %stenosed.  There is severe left ventricular systolic dysfunction.  LV end diastolic pressure is normal.  The left ventricular ejection fraction is less than 25% by visual  estimate.  1. Nonobstructive CAD 2. Severe LV dysfunction 3. Normal LV EDP   2D echo 12/20/2016 Study Conclusions  - Left ventricle: The cavity size was mildly dilated. Wall thickness was normal. Systolic function was severely reduced. The estimated ejection fraction was in the range of 20% to 25%. Diffuse hypokinesis. Features are consistent with a pseudonormal left ventricular filling pattern, with concomitant abnormal relaxation and increased filling pressure (grade 2 diastolic dysfunction). - Aortic valve: There was mild regurgitation. - Mitral valve: There was mild to moderate regurgitation. - Right atrium: The atrium was mildly dilated. - Tricuspid valve: There was moderate regurgitation. - Pulmonary arteries: Systolic pressure was moderately increased. PA peak pressure: 40 mm Hg (S).    Patient Profile     66 y.o. male with chronic  systolic and diastolic heart failure, colon cancer s/p colostomy, hypertension, admitted 5/31 with near syncope thought to be due to intravascular volume depletion.  He was discharged 5/30 with new diagnosis of heart failure and admitted 5/31 with near syncope.    Assessment & Plan    # Orthostatic hypotension: Patient was profoundly orthostatic on admission.  Lasix and Entresto have been held.  Carvedilol was also discontinued due to concern for chronotropic incompetence.  # Chronic systolic and diastolic heart failure: # NICM: # Hypertensive heart disease: LVEF 20-25% with grade 2 diastolic dysfunction. Carvedilol held due to chronotropic incompetence. Holding Lasix as above.  His weight has been stable and he is euvolemic.  BP is now elevated.  We will start losartan 12.5mg  daily.  If he is stable and not orthostatic this afternoon, may be able to discharge home today.  # PVCs: Mr. [place]cotton has frequent PVCs.  It is unclear whether this is the cause of his cardiomyopathy.  Will get a 24 hour Holter on discharge to better quantify his PVC burden.  Beta blocker held 2/2 chronotropic incompetence and near syncope.    Signed, Skeet Latch, MD  12/27/2016, 10:05 AM

## 2016-12-27 NOTE — Discharge Summary (Signed)
Discharge Summary    Patient ID: David Underwood,  MRN: [id number], DOB/AGE: 1950-11-28 66 y.o.  Admit date: 12/23/2016 Discharge date: 12/27/2016  Primary Care Provider: [facility] Primary Cardiologist: Dr. .Radford Pax   Discharge Diagnoses    Principal Problem:   Orthostatic hypotension Active Problems:   Coronary artery disease, non-occlusive with cath 12/21/16   Syncope and collapse   Chronic combined systolic and diastolic heart failure (HCC)   NICM (nonischemic cardiomyopathy) (HCC)   PVC's (premature ventricular contractions)   Benign essential HTN   Allergies Allergies  Allergen Reactions  . Aspirin Other (See Comments)    "stomach irritation", GI bleeding, also mentions colon CA    Diagnostic Studies/Procedures    12/21/16  Prior to this admit  Cardiac cath  By Dr. Martinique Procedures   Left Heart Cath and Coronary Angiography  Conclusion     Mid RCA lesion, 30 %stenosed.  RPDA-2 lesion, 50 %stenosed.  RPDA-1 lesion, 50 %stenosed.  Prox LAD to Mid LAD lesion, 30 %stenosed.  Ost 2nd Mrg to 2nd Mrg lesion, 15 %stenosed.  There is severe left ventricular systolic dysfunction.  LV end diastolic pressure is normal.  The left ventricular ejection fraction is less than 25% by visual estimate.   1. Nonobstructive CAD 2. Severe LV dysfunction 3. Normal LV EDP  Plan: Medical management.     _____________   History of Present Illness     66 y.o. Male with history of GERD, colon CA s/p colostomy, CHF and hypertension who was recently discharged after admit for CHF and cath with severely reduced EF to 25% and non obstructive CAD.  EKG on that admit with sinus bradycardia with nonspecific IVCD and diffuse T wave abnormality c/w inferolateral ischemia and anterior infarct.   An echocardiogram showed LV EF 20-25% with diffuse hypokinesis and grade 2 DD. He was discharged home on BB, statin, lasix and Entresto.  Readmitted 12/23/16 for a near  syncopal episode while sitting.  Developed nausea, dizziness, SOB neck pain and epigastric pain and very diaphoretic.   He was given IV fluids by EMS, and admitted to [facility].  SCr. Was slightly elevated to 1.65, and BUN of 21.  He was orthostatic with drop from lying 136/95 to 91/56 with standing.  meds held.  Hospital Course     Consultants: none   Meds held and pt remained orthostatic.  Cr. Improved with holding diuretic.  Weight was stable at 208 during admit.  He was neg 1,170 at discharge.  He did have frequent PVCs  And some evidence of junctional rhythm intermittently.  His BB was stopped as well.    By 12/27/16 he felt well, no lightheadedness no dizziness. No chest pain.  He was seen by Dr. Oval Linsey and losartan 12.5 mg daily started.  He had orthostatic BP check and BP was stable BP lying 145/84 P 72 Sitting  126/80 p-70 Standing 120/81 P 81 Standing for 3 min 133/79 P 85 He was walking in hall without any problems.   He continued with PVCs unclear if this is cause of his cardiomyopathy.  Will need 24 hour holter at discharge to eval PVC burden.  BB held 2/2 to possible chronotropic incompetence and near syncope.    Dr. Oval Linsey found pt stable for discharge - will keep follow up in 2 days to reeval BP and volume.  Continues on crstor, ASA and low dose losartan.      _____________  Discharge Vitals Blood pressure (!) 130/106, pulse  66, temperature 97.9 F (36.6 C), temperature source Oral, resp. rate 18, height 5\' 11"  (1.803 m), weight 208 lb 4.8 oz (94.5 kg), SpO2 100 %.  Filed Weights   12/25/16 0433 12/26/16 0605 12/27/16 0409  Weight: 208 lb 8.9 oz (94.6 kg) 208 lb 3.2 oz (94.4 kg) 208 lb 4.8 oz (94.5 kg)    Labs & Radiologic Studies    CBC No results for input(s): WBC, NEUTROABS, HGB, HCT, MCV, PLT in the last 72 hours. Basic Metabolic Panel  Recent Labs  12/25/16 0422 12/26/16 0229 12/27/16 0342  NA 135 133* 136  K 3.6 3.7 3.7  CL 104 106 107  CO2 22 21* 22    GLUCOSE 98 126* 98  BUN 21* 19 16  CREATININE 1.43* 1.28* 1.38*  CALCIUM 8.9 8.9 9.1  MG 2.1  --   --    Liver Function Tests No results for input(s): AST, ALT, ALKPHOS, BILITOT, PROT, ALBUMIN in the last 72 hours. No results for input(s): LIPASE, AMYLASE in the last 72 hours. Cardiac Enzymes Troponin not done  BNP Invalid input(s): POCBNP D-Dimer No results for input(s): DDIMER in the last 72 hours. Hemoglobin A1C No results for input(s): HGBA1C in the last 72 hours. Fasting Lipid Panel No results for input(s): CHOL, HDL, LDLCALC, TRIG, CHOLHDL, LDLDIRECT in the last 72 hours. Thyroid Function Tests No results for input(s): TSH, T4TOTAL, T3FREE, THYROIDAB in the last 72 hours.  Invalid input(s): FREET3 _____________  Dg Chest 2 View  Result Date: 12/19/2016 CLINICAL DATA:  Acute onset of epigastric abdominal pain and pressure. Indigestion. Initial encounter. EXAM: CHEST  2 VIEW COMPARISON:  None. FINDINGS: The lungs are well-aerated. Pulmonary vascularity is at the upper limits of normal. There is no evidence of focal opacification, pleural effusion or pneumothorax. The heart is borderline normal in size. No acute osseous abnormalities are seen. IMPRESSION: No acute cardiopulmonary process seen. Electronically Signed   By: Garald Balding M.D.   On: 12/19/2016 23:18   Disposition   Pt is being discharged home today in good condition.  Follow-up Plans & Appointments   Heart healthy low salt diet.    Weigh daily if weight increases by 3 pounds in a day call the office  Keep follow up.  Follow-up Information    Charlie Pitter, PA-C Follow up.   Specialties:  Cardiology, Radiology Why:  keep follow up in 2 days 12/29/16 at 10:30 AM to recheck BP and make sure stable.  they should have monitor for you to wear at that point as well.   Contact information: [address] [place] [address] [phone]            Discharge Medications    Discharge Medication List as of 12/27/2016  6:09 PM    START taking these medications   Details  losartan (COZAAR) 25 MG tablet Take 0.5 tablets (12.5 mg total) by mouth daily., Starting Tue 12/28/2016, Normal      CONTINUE these medications which have NOT CHANGED   Details  aspirin 81 MG EC tablet Take 1 tablet (81 mg total) by mouth daily., Starting Thu 12/23/2016, Normal    pantoprazole (PROTONIX) 40 MG tablet Take 1 tablet (40 mg total) by mouth daily., Starting Thu 12/23/2016, Normal    rosuvastatin (CRESTOR) 20 MG tablet Take 20 mg by mouth at bedtime. , Starting Fri 12/17/2016, Historical Med    nitroGLYCERIN (NITROSTAT) 0.4 MG SL tablet Place 1 tablet (0.4 mg total) under the tongue  every 5 (five) minutes x 3 doses as needed for chest pain., Starting Wed 12/22/2016, Normal      STOP taking these medications     carvedilol (COREG) 6.25 MG tablet      furosemide (LASIX) 40 MG tablet      potassium chloride SA (K-DUR,KLOR-CON) 20 MEQ tablet      sacubitril-valsartan (ENTRESTO) 24-26 MG           Outstanding Labs/Studies   Recheck orthostatic BP  24 hour holter for PVCs.   Duration of Discharge Encounter   Greater than 30 minutes including physician time.  Signed, Cecilie Kicks NP 12/27/2016, 7:11 PM

## 2016-12-28 ENCOUNTER — Encounter: Payer: Self-pay | Admitting: Physician Assistant

## 2016-12-28 NOTE — Progress Notes (Signed)
Cardiology Office Note    Date:  12/29/2016  ID:  David Underwood, DOB 10-06-1950, MRN [id number] PCP:  [facility]  Cardiologist:  Dr. Radford Pax   Chief Complaint: f/u near-syncope  History of Present Illness:  David Underwood is a 65 y.o. male with history of GERD, colon CA s/p colostomy, HTN, ?CKD stage III, recently diagnosed NICM, nonobstructive CAD, PVCs, orthostatic hypotension and junctional rhythm who presents for post-hospital followup of 2 recent admissions. He was admitted 12/19/2016 with nausea, vomiting, epigastric pain and diaphoresis and found to have elevated troponin/BNP. He also had abnormal EKG with nonspecific IVCD and diffuse T wave abnormality c/w inferolateral ischemia and anterior infarct. 2D echo 12/20/16 showed EF 20-25%, grade 2 DD, mild AI, mild-mod MR, mod TR, PASP 40. Underwent LHC 12/21/16 with nonobstructive CAD, normal LVEDP (30-% mRCA, 50% RPDA2, 50% RPDA1, 30% prox-mid LAD, 15% OM2, EF <25%. Optimization of medication regimen recommended. TSH/A1C wnl, LDL 109, CMET OK except Cr in the 1.4 range without prior to compare to. He was noted to have frequent PVCs on telemetry, unclear if contributing to presentation. He was discharged on carvedilol, Lasix, potassium, Crestor and Entresto. He was re-admitted 12/23/16 with an episode of near-syncope while sitting. Per report he developed nausea, dizziness, SOB neck pain and epigastric pain and became very diaphoretic. He was given IV fluids by EMS and admitted to [facility]. Labwork showed mild increase in creatinine (from recent baseline) of 1.65. He was noted to be orthostatic as well as with frequent PVCs and junctional rhythm intermittently. All antihypertensives were held. BB held 2/2 to possible chronotropic incompetence and near syncope. On day of DC he was restarted on losartan 12.5mg  daily and advised to keep close f/u. Last BMET 12/27/16 with K 3.7, BUN 16, Cr 1.38, Mg 2.1. OP Holter with possible referral to EP  was recommended.  He presents back to clinic for follow-up - was just discharged 12/27/16. I see order in for event monitor but this has not been scheduled yet. He is feeling well. No CP, SOB, diaphoresis, recurrent dizziness, near-syncope. He denies any cocaine or alcohol use. He had the flu in December/January but no other recent viral illnesses. He was unaware of any prior kidney dysfunction. He reports a long history of intermittent "whelps" that show up on his back ever since his colon surgery in the 1990s (also had chemo at that time). He usually applies Neosporin and they are gone within 2 days. He talked to his PCP about this several years ago and was told it was possibly shingles but it has recurred in the years since then. He does not presently have any.    Past Medical History:  Diagnosis Date  . Back pain   . Chronic combined systolic and diastolic heart failure ([place]) 12/24/2016  . Colon cancer [facility])    s/p colostomy  . GERD (gastroesophageal reflux disease)    with associated esophagitis on PPI  . Hyperlipidemia   . Hypertension   . Junctional rhythm   . Myocardial infarction ([place])   . NICM (nonischemic cardiomyopathy) ([place])   . Nonobstructive atherosclerosis of coronary artery    a. 12/21/16- Cath: non-obstructive CAD, EF <25%.  . Orthostatic hypotension   . PVC's (premature ventricular contractions)     Past Surgical History:  Procedure Laterality Date  . COLON SURGERY    . LEFT HEART CATH AND CORONARY ANGIOGRAPHY N/A 12/21/2016   Procedure: Left Heart Cath and Coronary Angiography;  Surgeon: Martinique, Peter M, MD;  Location: [place] [facility];  Service: Cardiovascular;  Laterality: N/A;    Current Medications: Current Outpatient Prescriptions  Medication Sig Dispense Refill  . aspirin 81 MG EC tablet Take 1 tablet (81 mg total) by mouth daily. 30 tablet 11  . losartan (COZAAR) 25 MG tablet Take 0.5 tablets (12.5 mg total) by mouth daily. 16 tablet 6  . nitroGLYCERIN  (NITROSTAT) 0.4 MG SL tablet Place 1 tablet (0.4 mg total) under the tongue every 5 (five) minutes x 3 doses as needed for chest pain. 25 tablet 12  . pantoprazole (PROTONIX) 40 MG tablet Take 1 tablet (40 mg total) by mouth daily. 30 tablet 5  . rosuvastatin (CRESTOR) 20 MG tablet Take 20 mg by mouth at bedtime.      No current facility-administered medications for this visit.      Allergies:   Aspirin   Social History   Social History  . Marital status: Married    Spouse name: N/A  . Number of children: N/A  . Years of education: N/A   Social History Main Topics  . Smoking status: Never Smoker  . Smokeless tobacco: Never Used  . Alcohol use No  . Drug use: No  . Sexual activity: No   Other Topics Concern  . None   Social History Narrative  . None     Family History:  Family History  Problem Relation Age of Onset  . Colon cancer Mother   . Deep vein thrombosis Father   . Heart disease Brother   . Heart attack Brother     ROS:   Please see the history of present illness.  All other systems are reviewed and otherwise negative.    PHYSICAL EXAM:   VS:  BP 108/80   Pulse 76   Ht 5\' 11"  (1.803 m)   Wt 211 lb (95.7 kg)   BMI 29.43 kg/m   BMI: Body mass index is 29.43 kg/m. GEN: Well nourished, well developed AAM, in no acute distress  HEENT: normocephalic, atraumatic Neck: no JVD, carotid bruits, or masses Cardiac: RRR; no murmurs, rubs, or gallops, no edema  Respiratory:  clear to auscultation bilaterally, normal work of breathing GI: soft, nontender, nondistended, + BS MS: no deformity or atrophy  Skin: warm and dry, no rash, right radial cath site without hematoma or ecchymosis; good pulse. Neuro:  Alert and Oriented x 3, Strength and sensation are intact, follows commands Psych: euthymic mood, full affect  Wt Readings from Last 3 Encounters:  12/29/16 211 lb (95.7 kg)  12/27/16 208 lb 4.8 oz (94.5 kg)  12/22/16 207 lb 12.8 oz (94.3 kg)       Studies/Labs Reviewed:   EKG:  EKG was ordered today and personally reviewed by me and demonstrates NSR 76bpm, occ PVCs, nonspecific diffuse TW changes - TWI I, II, avL, V5-V6, one PVC. No sig change from prior.  Recent Labs: 12/19/2016: B Natriuretic Peptide 445.3 12/20/2016: ALT 20; TSH 1.197 12/22/2016: Hemoglobin 16.6; Platelets 190 12/25/2016: Magnesium 2.1 12/27/2016: BUN 16; Creatinine, Ser 1.38; Potassium 3.7; Sodium 136   Lipid Panel    Component Value Date/Time   CHOL 168 12/20/2016 0454   TRIG 55 12/20/2016 0454   HDL 48 12/20/2016 0454   CHOLHDL 3.5 12/20/2016 0454   VLDL 11 12/20/2016 0454   LDLCALC 109 (H) 12/20/2016 0454    Additional studies/ records that were reviewed today include: Summarized above, also: 12/21/16 Cardiac cath  By Dr. Martinique Left Heart Cath and Coronary Angiography  Conclusion     Mid RCA lesion, 30 %stenosed.  RPDA-2 lesion, 50 %stenosed.  RPDA-1 lesion, 50 %stenosed.  Prox LAD to Mid LAD lesion, 30 %stenosed.  Ost 2nd Mrg to 2nd Mrg lesion, 15 %stenosed.  There is severe left ventricular systolic dysfunction.  LV end diastolic pressure is normal.  The left ventricular ejection fraction is less than 25% by visual estimate.  1. Nonobstructive CAD 2. Severe LV dysfunction 3. Normal LV EDP  Plan: Medical management.   --------------------------------  ASSESSMENT & PLAN:   1. Chronic combined CHF/NICM - asymptomatic; appears euvolemic today. Long discussion about low sodium diet (was eating bologna sandwiches regularly), daily weights, fluid restriction, & observation for symptoms of CHF. See above regarding recent med adjustments - it appears that his blood pressure will only tolerate low dose losartan at present time. Will arrange 24 hour heart monitor as previously recommended to quantify PVCs, as well as evaluate for recurrent bradycardia and junctional rhythm. If he has significant PVC burden, will need referral to EP to  discuss antiarrhythmic therapy as this could be contributing to his cardiomyopathy. The etiology of this is not known at this time. CAD was out of proportion to cardiomyopathy. He was previously mildly hypertensive but only required 1 low dose medication in the past per his report. He denies any alcohol or cocaine use. He did have a viral illness in December/January but nothing recent. I would like him to see the [facility] for further evaluation. May need further workup of possible autoimmune etiology or consideration of cMRI to evaluate for infiltrative process. Check BMET today given losartan resumption recently. 2. Orthostatic hypotension - improved with d/c of multiple medications. Follow. 3. Nonobstructive CAD - continue risk factor modification. Continue ASA. Continue statin. Recheck liver function/LFTs in 4 weeks. 4. PVCs - plan to quantify by 24-hour Holter as previously arranged (order already placed by Cecilie Kicks, NP in system). 5. Junctional rhythm - follow by Holter monitoring. No recurrent symptoms. 6. CKD III - recheck today.  Disposition: Will refer to Advanced Heart Failure clinic for further evaluation.  Medication Adjustments/Labs and Tests Ordered: Current medicines are reviewed at length with the patient today.  Concerns regarding medicines are outlined above. Medication changes, Labs and Tests ordered today are summarized above and listed in the Patient Instructions accessible in Encounters.   Signed, Charlie Pitter, PA-C  12/29/2016 10:46 AM    [address], [place], [place]  [address] Phone: [phone]; Fax: [phone]

## 2016-12-29 ENCOUNTER — Ambulatory Visit (INDEPENDENT_AMBULATORY_CARE_PROVIDER_SITE_OTHER): Payer: Medicare HMO | Admitting: Physician Assistant

## 2016-12-29 ENCOUNTER — Encounter: Payer: Self-pay | Admitting: Physician Assistant

## 2016-12-29 VITALS — BP 108/80 | HR 76 | Ht 71.0 in | Wt 211.0 lb

## 2016-12-29 DIAGNOSIS — I428 Other cardiomyopathies: Secondary | ICD-10-CM

## 2016-12-29 DIAGNOSIS — I2510 Atherosclerotic heart disease of native coronary artery without angina pectoris: Secondary | ICD-10-CM

## 2016-12-29 DIAGNOSIS — I493 Ventricular premature depolarization: Secondary | ICD-10-CM

## 2016-12-29 DIAGNOSIS — I951 Orthostatic hypotension: Secondary | ICD-10-CM

## 2016-12-29 DIAGNOSIS — I498 Other specified cardiac arrhythmias: Secondary | ICD-10-CM

## 2016-12-29 DIAGNOSIS — N183 Chronic kidney disease, stage 3 unspecified: Secondary | ICD-10-CM

## 2016-12-29 DIAGNOSIS — I5042 Chronic combined systolic (congestive) and diastolic (congestive) heart failure: Secondary | ICD-10-CM

## 2016-12-29 LAB — BASIC METABOLIC PANEL
BUN/Creatinine Ratio: 16 (ref 10–24)
BUN: 21 mg/dL (ref 8–27)
CO2: 17 mmol/L — AB (ref 18–29)
CREATININE: 1.31 mg/dL — AB (ref 0.76–1.27)
Calcium: 9.6 mg/dL (ref 8.6–10.2)
Chloride: 105 mmol/L (ref 96–106)
GFR calc Af Amer: 66 mL/min/{1.73_m2} (ref >59)
GFR, EST NON AFRICAN AMERICAN: 57 mL/min/{1.73_m2} — AB (ref >59)
GLUCOSE: 105 mg/dL — AB (ref 65–99)
Potassium: 4.7 mmol/L (ref 3.5–5.2)
SODIUM: 138 mmol/L (ref 134–144)

## 2016-12-29 NOTE — Patient Instructions (Addendum)
  Medication Instructions:  Your physician recommends that you continue on your current medications as directed. Please refer to the Current Medication list given to you today.  Labwork: Your physician recommends that you have lab work today: Bmet Your physician recommends that you return for a FASTING lipid profile on July 17 between 8-5   Testing/Procedures: Your physician has recommended that you wear a holter monitor. Holter monitors are medical devices that record the heart's electrical activity. Doctors most often use these monitors to diagnose arrhythmias. Arrhythmias are problems with the speed or rhythm of the heartbeat. The monitor is a small, portable device. You can wear one while you do your normal daily activities. This is usually used to diagnose what is causing palpitations/syncope (passing out).    Follow-Up: The CHF Clinic will be calling the patient to send up appointment.  Any Other Special Instructions Will Be Listed Below (If Applicable).  For patients with congestive heart failure, we give them these special instructions:  1. Follow a low-salt diet and watch your fluid intake. In general, you should not be taking in more than 2 liters of fluid per day (no more than 8 glasses per day). Some patients are restricted to less than 1.5 liters of fluid per day (no more than 6 glasses per day). This includes sources of water in foods like soup, coffee, tea, milk, etc. 2. Weigh yourself on the same scale at same time of day and keep a log. 3. Call your doctor: (Anytime you feel any of the following symptoms)  - 3-4 pound weight gain in 1-2 days or 2 pounds overnight  - Shortness of breath, with or without a dry hacking cough  - Swelling in the hands, feet or stomach  - If you have to sleep on extra pillows at night in order to breathe   IT IS IMPORTANT TO LET YOUR DOCTOR KNOW EARLY ON IF YOU ARE HAVING SYMPTOMS SO WE CAN HELP YOU!

## 2016-12-30 NOTE — Progress Notes (Signed)
I would give him a week out and should not work outside in heat > 85 degrees which is going to be hard for him

## 2016-12-31 ENCOUNTER — Telehealth: Payer: Self-pay | Admitting: Physician Assistant

## 2016-12-31 NOTE — Telephone Encounter (Signed)
Follow Up:   Returning David Underwood's call from this morning.

## 2016-12-31 NOTE — Telephone Encounter (Signed)
Left message to call back  

## 2016-12-31 NOTE — Telephone Encounter (Signed)
S/w pt to inform pt of Dr. Theodosia Blender recommendation's.

## 2016-12-31 NOTE — Telephone Encounter (Signed)
Discussed returning to work with Dr. Radford Pax. He inquired about returning to landscaping. Per her recommendation: "I would give him a week out and should not work outside in heat > 85 degrees which is going to be hard for him." Please inform patient. Can provide work note if needed.  Dayna Dunn PA-C

## 2017-01-06 ENCOUNTER — Encounter (HOSPITAL_COMMUNITY): Payer: Self-pay | Admitting: Internal Medicine

## 2017-01-06 ENCOUNTER — Ambulatory Visit (HOSPITAL_COMMUNITY)
Admission: RE | Admit: 2017-01-06 | Discharge: 2017-01-06 | Disposition: A | Discharge status: Home / Self Care | Payer: Medicare HMO | Source: Ambulatory Visit | Attending: Internal Medicine | Admitting: Internal Medicine

## 2017-01-06 VITALS — BP 122/90 | HR 78 | Wt 212.8 lb

## 2017-01-06 DIAGNOSIS — K219 Gastro-esophageal reflux disease without esophagitis: Secondary | ICD-10-CM | POA: Insufficient documentation

## 2017-01-06 DIAGNOSIS — Z7982 Long term (current) use of aspirin: Secondary | ICD-10-CM | POA: Diagnosis not present

## 2017-01-06 DIAGNOSIS — I493 Ventricular premature depolarization: Secondary | ICD-10-CM

## 2017-01-06 DIAGNOSIS — N183 Chronic kidney disease, stage 3 (moderate): Secondary | ICD-10-CM | POA: Insufficient documentation

## 2017-01-06 DIAGNOSIS — R0683 Snoring: Secondary | ICD-10-CM | POA: Diagnosis not present

## 2017-01-06 DIAGNOSIS — I2510 Atherosclerotic heart disease of native coronary artery without angina pectoris: Secondary | ICD-10-CM | POA: Diagnosis not present

## 2017-01-06 DIAGNOSIS — I429 Cardiomyopathy, unspecified: Secondary | ICD-10-CM | POA: Insufficient documentation

## 2017-01-06 DIAGNOSIS — R112 Nausea with vomiting, unspecified: Secondary | ICD-10-CM | POA: Insufficient documentation

## 2017-01-06 DIAGNOSIS — R001 Bradycardia, unspecified: Secondary | ICD-10-CM | POA: Diagnosis not present

## 2017-01-06 DIAGNOSIS — I454 Nonspecific intraventricular block: Secondary | ICD-10-CM | POA: Insufficient documentation

## 2017-01-06 DIAGNOSIS — Z933 Colostomy status: Secondary | ICD-10-CM | POA: Diagnosis not present

## 2017-01-06 DIAGNOSIS — Z888 Allergy status to other drugs, medicaments and biological substances status: Secondary | ICD-10-CM | POA: Insufficient documentation

## 2017-01-06 DIAGNOSIS — I951 Orthostatic hypotension: Secondary | ICD-10-CM | POA: Diagnosis not present

## 2017-01-06 DIAGNOSIS — R0602 Shortness of breath: Secondary | ICD-10-CM | POA: Diagnosis not present

## 2017-01-06 DIAGNOSIS — Z85038 Personal history of other malignant neoplasm of large intestine: Secondary | ICD-10-CM | POA: Diagnosis not present

## 2017-01-06 DIAGNOSIS — E785 Hyperlipidemia, unspecified: Secondary | ICD-10-CM | POA: Insufficient documentation

## 2017-01-06 DIAGNOSIS — I130 Hypertensive heart and chronic kidney disease with heart failure and stage 1 through stage 4 chronic kidney disease, or unspecified chronic kidney disease: Secondary | ICD-10-CM | POA: Insufficient documentation

## 2017-01-06 DIAGNOSIS — I5042 Chronic combined systolic (congestive) and diastolic (congestive) heart failure: Secondary | ICD-10-CM | POA: Insufficient documentation

## 2017-01-06 DIAGNOSIS — Z800 Family history of malignant neoplasm of digestive organs: Secondary | ICD-10-CM | POA: Insufficient documentation

## 2017-01-06 DIAGNOSIS — Z8249 Family history of ischemic heart disease and other diseases of the circulatory system: Secondary | ICD-10-CM | POA: Diagnosis not present

## 2017-01-06 DIAGNOSIS — I252 Old myocardial infarction: Secondary | ICD-10-CM | POA: Insufficient documentation

## 2017-01-06 MED ORDER — SPIRONOLACTONE 25 MG PO TABS: 12.5000 mg | ORAL_TABLET | Freq: Every day | ORAL | 3 refills | Status: DC

## 2017-01-06 NOTE — Progress Notes (Signed)
Advanced Heart Failure Medication Review by a Pharmacist  Does the patient  feel that his/her medications are working for him/her?  yes  Has the patient been experiencing any side effects to the medications prescribed?  No besides joint pain since starting crestor  Does the patient measure his/her own blood pressure or blood glucose at home?  yes   Does the patient have any problems obtaining medications due to transportation or finances?   no  Understanding of regimen: good Understanding of indications: good Potential of compliance: good Patient understands to avoid NSAIDs. Patient understands to avoid decongestants.  Issues to address at subsequent visits: muscle pain   Pharmacist comments: David Underwood is a pleasant 66 yo male presenting with meds today. Of note, patient complains of bilateral joint pain in knees since recent start of crestor, has improved with co-administration of Co-Q 10 daily. Per patient, has had Entresto before and BP did not tolerate so he was taken off of it.   Carlean Jews, Pharm.D. PGY1 Pharmacy Resident 6/14/201810:03 AM Pager [phone]   Time with patient: 7 mins Preparation and documentation time: 2 mins Total time: 9 mins

## 2017-01-06 NOTE — Progress Notes (Signed)
Advanced Heart Failure Clinic Note    Primary Care: [facility] Primary Cardiologist: Dr. Radford Pax   HPI: David Underwood is a 66 y.o. male with history of GERD, colon CA s/p colostomy, HTN, CKD stage III, recently diagnosed NICM, nonobstructive CAD, PVCs, orthostatic hypotension and junctional rhythm. Also with history of newly diagnosed systolic CHF, echo in 0/3500 with EF 20-25% grade 2 DD, mild AI, mild-mod MR, mod TR, PASP 40.   Admitted 12/19/16-12/22/16 with DOE, nausea and vomiting. Troponin 0.26, EKG showed sinus bradycardia with diffuse T wave abnormalities c/w inferolateral ischemia. Left heart cath showed nonobstructive CAD. Noted to have frequent PVC's on tele. He was started on Entresto at discharge.   Admitted 12/23/16-12/27/16 with near syncope, nausea and SOB. He was orthostatic sitting BP 136/95, standing 91/56. His beta blocker was stopped because of bradycardia and some junctional rhythms noted on tele. Frequent PVC's continued on tele. His Entresto was stopped. Discharge weight was 208 pounds.   He presents today for HF evaluation. He feels well today, denies dizziness and orthostatic symptoms. He is able to walk around outside, walk throughout stores without SOB. He denies orthopnea and PND. Does have mild edema and weight up a bit recently. His wife is present and says that he minimizes a lot of his symptoms.He continues to try to work in the yard but cant do as much as he used to.   Review of Systems: [y] = yes, [ ]  = no   General: Weight gain [ y]; Weight loss [ ] ; Anorexia [ ] ; Fatigue [ y]; Fever [ ] ; Chills [ ] ; Weakness [ ]   Cardiac: Chest pain/pressure [ ] ; Resting SOB [ ] ; Exertional SOB Blue.Reese ]; Orthopnea [ ] ; Pedal Edema Blue.Reese ]; Palpitations [ ] ; Syncope [ y]; Presyncope [ y; Paroxysmal nocturnal dyspnea[ ]   Pulmonary: Cough [ ] ; Wheezing[ ] ; Hemoptysis[ ] ; Sputum [ ] ; Snoring [ ]   GI: Vomiting[ ] ; Dysphagia[ ] ; Melena[ ] ; Hematochezia [ ] ; Heartburn[ ] ;  Abdominal pain [ ] ; Constipation [ ] ; Diarrhea [ ] ; BRBPR [ ]   GU: Hematuria[ ] ; Dysuria [ ] ; Nocturia[ ]   Vascular: Pain in legs with walking [ ] ; Pain in feet with lying flat [ ] ; Non-healing sores [ ] ; Stroke [ ] ; TIA [ ] ; Slurred speech [ ] ;  Neuro: Headaches[ ] ; Vertigo[ ] ; Seizures[ ] ; Paresthesias[ ] ;Blurred vision [ ] ; Diplopia [ ] ; Vision changes [ ]   Ortho/Skin: Arthritis Blue.Reese ]; Joint pain [ y]; Muscle pain [ ] ; Joint swelling [ ] ; Back Pain [ ] ; Rash [ ]   Psych: Depression[ ] ; Anxiety[ ]   Heme: Bleeding problems [ ] ; Clotting disorders [ ] ; Anemia [ ]   Endocrine: Diabetes [ ] ; Thyroid dysfunction[ ]    Past Medical History:  Diagnosis Date  . Back pain   . Chronic combined systolic and diastolic heart failure ([place]) 12/24/2016  . Colon cancer [facility])    s/p colostomy  . GERD (gastroesophageal reflux disease)    with associated esophagitis on PPI  . Hyperlipidemia   . Hypertension   . Junctional rhythm   . Myocardial infarction ([place])   . NICM (nonischemic cardiomyopathy) ([place])   . Nonobstructive atherosclerosis of coronary artery    a. 12/21/16- Cath: non-obstructive CAD, EF <25%.  . Orthostatic hypotension   . PVC's (premature ventricular contractions)     Current Outpatient Prescriptions  Medication Sig Dispense Refill  . aspirin 81 MG EC tablet Take 1 tablet (81 mg total) by mouth daily. 30 tablet 11  .  co-enzyme Q-10 30 MG capsule Take 30 mg by mouth daily.    Marland Kitchen losartan (COZAAR) 25 MG tablet Take 0.5 tablets (12.5 mg total) by mouth daily. 16 tablet 6  . nitroGLYCERIN (NITROSTAT) 0.4 MG SL tablet Place 1 tablet (0.4 mg total) under the tongue every 5 (five) minutes x 3 doses as needed for chest pain. 25 tablet 12  . pantoprazole (PROTONIX) 40 MG tablet Take 1 tablet (40 mg total) by mouth daily. 30 tablet 5  . rosuvastatin (CRESTOR) 20 MG tablet Take 20 mg by mouth at bedtime.      No current facility-administered medications for this encounter.     Allergies    Allergen Reactions  . Aspirin Other (See Comments)    "stomach irritation", GI bleeding, also mentions colon CA      Social History   Social History  . Marital status: Married    Spouse name: N/A  . Number of children: N/A  . Years of education: N/A   Occupational History  . Not on file.   Social History Main Topics  . Smoking status: Never Smoker  . Smokeless tobacco: Never Used  . Alcohol use No  . Drug use: No  . Sexual activity: No   Other Topics Concern  . Not on file   Social History Narrative  . No narrative on file      Family History  Problem Relation Age of Onset  . Colon cancer Mother   . Deep vein thrombosis Father   . Heart disease Brother   . Heart attack Brother     Vitals:   01/06/17 0946  BP: 122/90  Pulse: 78  SpO2: 97%  Weight: 212 lb 12.8 oz (96.5 kg)   Filed Weights   01/06/17 0946  Weight: 212 lb 12.8 oz (96.5 kg)    PHYSICAL EXAM: General:  Well appearing. No respiratory difficulty HEENT: normal Neck: supple. 7-8 cm JVD. Carotids 2+ bilat; no bruits. No lymphadenopathy or thyromegaly appreciated. Cor: PMI laterally displaced. Regular rate & rhythm. No rubs, gallops or murmurs. Lungs: clear Abdomen: soft, nontender, nondistended. No hepatosplenomegaly. No bruits or masses. Good bowel sounds. Extremities: no cyanosis, clubbing, rash, trace edema Neuro: alert & oriented x 3, cranial nerves grossly intact. moves all 4 extremities w/o difficulty. Affect pleasant.  ECG: NSR 71 lateral TWI Personally reviewed    ASSESSMENT & PLAN:  1. Acute on chronic systolic HF - due to NICM. EF  20-25%. Cath 5/18. Non-obstructive CAD - NYHA II-early III. Volume status minimally elevated. 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 If > 10% start amio. - Order cMRI to evaluate for  infiltrative disease and scar - Start spiro 12.5 daily. Labs in 1 week.  - Continue losartan 12.5 daily - No b-blocker yet.   2. CAD, nonobstructive - Continue ASA and statin  3. Snoring - Order sleep study.   4. HTN - Blood pressure well controlled. Adding spiro for HF  David Booze, David Underwood  Patient seen and examined with David Booze, NP. We discussed all aspects of the encounter. I have edited the note in detail above. 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 Will place event monitor and get PSG. Start spiro. If PVC burden > 10% wil start amio.    David Bickers, MD  7:55 PM

## 2017-01-06 NOTE — Patient Instructions (Signed)
Start Spironolactone 12.5 mg (1/2 tab) daily  Lab in 1 week  Your physician has recommended that you have a sleep study. This test records several body functions during sleep, including: brain activity, eye movement, oxygen and carbon dioxide blood levels, heart rate and rhythm, breathing rate and rhythm, the flow of air through your mouth and nose, snoring, body muscle movements, and chest and belly movement.  Your physician has requested that you have a cardiac MRI. Cardiac MRI uses a computer to create images of your heart as its beating, producing both still and moving pictures of your heart and major blood vessels. For further information please visit [email or web address] Please follow the instruction sheet given to you today for more information.  Your physician recommends that you schedule a follow-up appointment in: 4-6 weeks

## 2017-01-11 ENCOUNTER — Ambulatory Visit (INDEPENDENT_AMBULATORY_CARE_PROVIDER_SITE_OTHER): Payer: Medicare HMO

## 2017-01-11 ENCOUNTER — Telehealth (HOSPITAL_COMMUNITY): Payer: Self-pay | Admitting: *Deleted

## 2017-01-11 DIAGNOSIS — I428 Other cardiomyopathies: Secondary | ICD-10-CM | POA: Diagnosis not present

## 2017-01-11 DIAGNOSIS — I493 Ventricular premature depolarization: Secondary | ICD-10-CM

## 2017-01-11 NOTE — Telephone Encounter (Signed)
Pre cert pending for CMRI. Clinical notes faxed to [facility].

## 2017-01-13 ENCOUNTER — Ambulatory Visit (HOSPITAL_COMMUNITY)
Admission: RE | Admit: 2017-01-13 | Discharge: 2017-01-13 | Disposition: A | Discharge status: Home / Self Care | Payer: Medicare HMO | Source: Ambulatory Visit | Attending: Internal Medicine | Admitting: Internal Medicine

## 2017-01-13 DIAGNOSIS — I5042 Chronic combined systolic (congestive) and diastolic (congestive) heart failure: Secondary | ICD-10-CM | POA: Diagnosis present

## 2017-01-13 LAB — BASIC METABOLIC PANEL
Anion gap: 9 (ref 5–15)
BUN: 15 mg/dL (ref 6–20)
CALCIUM: 9.4 mg/dL (ref 8.9–10.3)
CO2: 21 mmol/L — AB (ref 22–32)
CREATININE: 1.47 mg/dL — AB (ref 0.61–1.24)
Chloride: 110 mmol/L (ref 101–111)
GFR calc Af Amer: 56 mL/min — ABNORMAL LOW (ref >60)
GFR, EST NON AFRICAN AMERICAN: 48 mL/min — AB (ref >60)
GLUCOSE: 131 mg/dL — AB (ref 65–99)
Potassium: 3.8 mmol/L (ref 3.5–5.1)
Sodium: 140 mmol/L (ref 135–145)

## 2017-01-18 ENCOUNTER — Telehealth: Payer: Self-pay | Admitting: *Deleted

## 2017-01-18 DIAGNOSIS — Z79899 Other long term (current) drug therapy: Secondary | ICD-10-CM

## 2017-01-18 MED ORDER — AMIODARONE HCL 200 MG PO TABS: ORAL_TABLET | ORAL | 0 refills | Status: DC

## 2017-01-18 MED ORDER — AMIODARONE HCL 200 MG PO TABS: 200.0000 mg | ORAL_TABLET | Freq: Every day | ORAL | 11 refills | Status: DC

## 2017-01-18 NOTE — Telephone Encounter (Signed)
-----   Message from Sueanne Margarita, MD sent at 01/16/2017 10:57 AM EDT ----- PVC load is 15% so may be etiology of CM.  Please start Amiodaroe 200mg  BID for 2 weeks then 200mg  daily.  Please have him come in for EKG in 1 week to check QTc. Please set up baseline PFTs with DLCO.  TSH and LFTs were normal.  Please have him followup with me in office in 4 weeks

## 2017-01-18 NOTE — Telephone Encounter (Signed)
Reviewed Dr. Theodosia Blender recommendations with patient.  Patient asked about side effects of amiodarone --reviewed that he will get EKG to eval conduction of heart rhythm and PFTs to get baseline lung function .  He is aware that [facility] will call to set up PFTs and that I am forwarding to Dr. Theodosia Blender nurse for 4 week follow up appointment as she requested.  Pt verbalizes understanding of this plan, including instructions for taking amiodarone.  He will pick up today and will decrease to daily on 02/02/17.    Nurse visit for EKG scheduled for Thursday July 5 at 11:00 am.

## 2017-01-21 ENCOUNTER — Encounter (HOSPITAL_COMMUNITY): Payer: Self-pay | Admitting: *Deleted

## 2017-01-21 NOTE — Progress Notes (Signed)
Received medical records request on patient from [employer].  Requested records faxed today to [phone].  Original request will be scanned to patient's electronic medical record.

## 2017-01-24 ENCOUNTER — Other Ambulatory Visit (HOSPITAL_COMMUNITY): Payer: Medicare HMO

## 2017-01-25 ENCOUNTER — Ambulatory Visit (HOSPITAL_COMMUNITY)
Admission: RE | Admit: 2017-01-25 | Discharge: 2017-01-25 | Disposition: A | Discharge status: Home / Self Care | Payer: Medicare HMO | Source: Ambulatory Visit | Attending: Cardiology | Admitting: Cardiology

## 2017-01-25 ENCOUNTER — Telehealth (HOSPITAL_COMMUNITY): Payer: Self-pay | Admitting: *Deleted

## 2017-01-25 DIAGNOSIS — Z79899 Other long term (current) drug therapy: Secondary | ICD-10-CM | POA: Diagnosis present

## 2017-01-25 LAB — PULMONARY FUNCTION TEST
DL/VA % pred: 76 %
DL/VA: 3.58 ml/min/mmHg/L
DLCO unc % pred: 55 %
DLCO unc: 18.71 ml/min/mmHg
FEF 25-75 POST: 2.06 L/s
FEF 25-75 PRE: 1.79 L/s
FEF2575-%CHANGE-POST: 14 %
FEF2575-%PRED-PRE: 64 %
FEF2575-%Pred-Post: 73 %
FEV1-%Change-Post: 3 %
FEV1-%PRED-POST: 83 %
FEV1-%Pred-Pre: 80 %
FEV1-PRE: 2.5 L
FEV1-Post: 2.6 L
FEV1FVC-%CHANGE-POST: 6 %
FEV1FVC-%PRED-PRE: 91 %
FEV6-%CHANGE-POST: -1 %
FEV6-%Pred-Post: 87 %
FEV6-%Pred-Pre: 88 %
FEV6-Post: 3.42 L
FEV6-Pre: 3.48 L
FEV6FVC-%Change-Post: 0 %
FEV6FVC-%PRED-POST: 102 %
FEV6FVC-%Pred-Pre: 101 %
FVC-%Change-Post: -2 %
FVC-%PRED-PRE: 87 %
FVC-%Pred-Post: 85 %
FVC-POST: 3.47 L
FVC-PRE: 3.56 L
PRE FEV1/FVC RATIO: 70 %
Post FEV1/FVC ratio: 75 %
Post FEV6/FVC ratio: 98 %
Pre FEV6/FVC Ratio: 98 %
RV % pred: 91 %
RV: 2.21 L
TLC % PRED: 82 %
TLC: 5.93 L

## 2017-01-25 MED ORDER — ALBUTEROL SULFATE (2.5 MG/3ML) 0.083% IN NEBU
2.5000 mg | INHALATION_SOLUTION | Freq: Once | RESPIRATORY_TRACT | Status: AC
  Administered 2017-01-25: 2.5 mg via RESPIRATORY_TRACT

## 2017-01-25 NOTE — Telephone Encounter (Signed)
Message sent to [facility] to call patient and schedule CMRI.   Authorization Number:  [id number]   Auth Effective Date:  01/12/2017   Auth End Date:  04/12/2017   Case Status:  Approved

## 2017-01-27 ENCOUNTER — Ambulatory Visit (INDEPENDENT_AMBULATORY_CARE_PROVIDER_SITE_OTHER): Payer: Medicare HMO | Admitting: *Deleted

## 2017-01-27 ENCOUNTER — Other Ambulatory Visit: Payer: Self-pay | Admitting: *Deleted

## 2017-01-27 VITALS — BP 152/80 | HR 70 | Ht 71.0 in | Wt 211.0 lb

## 2017-01-27 DIAGNOSIS — I493 Ventricular premature depolarization: Secondary | ICD-10-CM | POA: Diagnosis not present

## 2017-01-27 NOTE — Progress Notes (Signed)
1.) Reason for visit: 12 lead EKG  2.) Name of MD requesting visit: Dr Radford Pax   3.) H&P: Junctional Rhythm, PVC's.  4.) ROS related to problem: EKG was order a week after starting Amiodarone 200 mg  BID.  5.) Assessment and plan per MD:  EKG done per nurse read per Dr. Saunders Revel MD. Normal sinus rhythm, rate 70 beats/minute, ST and T Wave abnormality. Pt's BP 152/80. Pt was to start taking the Amiodarone BID for 2 weeks then 200 mg daily. Pt has not started  the medication, because the prescription was sent to a [facility] in [place]. The right  [facility] [address] [facility] [place] was called regarding this medication. They will be able to dispense this medication for pt.  Pt is aware and will start amiodarone today and return for an EKG on 7/17 at 9:00 AM.

## 2017-01-27 NOTE — Patient Instructions (Signed)
Pt is aware that he needs to start Amiodarone 200 mg today, and return for an EKG on 7/17 at 9:00 AM. Pt verbalized understanding.

## 2017-01-31 ENCOUNTER — Encounter: Payer: Self-pay | Admitting: Internal Medicine

## 2017-01-31 ENCOUNTER — Telehealth: Payer: Self-pay | Admitting: Internal Medicine

## 2017-01-31 NOTE — Telephone Encounter (Signed)
Called and LVM regarding cardiac MRI scheduled on 02-09-17 at 8 a.m., at [facility].  Letter to patient.

## 2017-02-08 ENCOUNTER — Other Ambulatory Visit: Payer: Medicare HMO

## 2017-02-08 ENCOUNTER — Ambulatory Visit (INDEPENDENT_AMBULATORY_CARE_PROVIDER_SITE_OTHER): Payer: Medicare HMO

## 2017-02-08 VITALS — BP 178/100 | HR 68 | Ht 71.0 in | Wt 214.0 lb

## 2017-02-08 DIAGNOSIS — Z79899 Other long term (current) drug therapy: Secondary | ICD-10-CM

## 2017-02-08 DIAGNOSIS — I2510 Atherosclerotic heart disease of native coronary artery without angina pectoris: Secondary | ICD-10-CM

## 2017-02-08 DIAGNOSIS — I493 Ventricular premature depolarization: Secondary | ICD-10-CM

## 2017-02-08 LAB — HEPATIC FUNCTION PANEL
ALK PHOS: 55 IU/L (ref 39–117)
ALT: 18 IU/L (ref 0–44)
AST: 20 IU/L (ref 0–40)
Albumin: 4 g/dL (ref 3.6–4.8)
BILIRUBIN TOTAL: 0.7 mg/dL (ref 0.0–1.2)
BILIRUBIN, DIRECT: 0.24 mg/dL (ref 0.00–0.40)
Total Protein: 6.4 g/dL (ref 6.0–8.5)

## 2017-02-08 LAB — LIPID PANEL
CHOLESTEROL TOTAL: 119 mg/dL (ref 100–199)
Chol/HDL Ratio: 2.2 ratio (ref 0.0–5.0)
HDL: 54 mg/dL (ref >39)
LDL Calculated: 53 mg/dL (ref 0–99)
Triglycerides: 61 mg/dL (ref 0–149)
VLDL Cholesterol Cal: 12 mg/dL (ref 5–40)

## 2017-02-08 NOTE — Progress Notes (Signed)
1.) Reason for visit: EKG  2.) Name of MD requesting visit: Dr. Radford Pax  3.) H&P: Patient has history of PVC's, HTN, CHF, and nonobstructive CAD. Patient's previous monitor results from 01/11/17 showed "PVC load is 15 %". Dr. Radford Pax started patient on amiodarone 200 mg BID on 01/28/17. Patient returned today for EKG 02/08/17.  4.) ROS related to problem: Patient vital signs BP 178/100, HR 65, and O2 95% on room air. Patient weight 214 lbs and height 5\' 11" . Patient's EKG Normal sinus rhythm, left axis deviation, incomplete LBBB.  5.) Assessment and plan per MD: Dr. Radford Pax reviewed EKG and patient's VS. She recommends patient to have a 24 hr. holter monitor and keep record of his BP twice daily for a week and call our office with results.

## 2017-02-08 NOTE — Progress Notes (Signed)
BP also elevated but typically runs low.  INstruct patient to check BP twice daily for a week and call with results

## 2017-02-08 NOTE — Patient Instructions (Addendum)
Medication Instructions:  Your physician recommends that you continue on your current medications as directed. Please refer to the Current Medication list given to you today.  Labwork: NONE  Testing/Procedures: Your physician has recommended that you wear a 24 hour holter monitor. Holter monitors are medical devices that record the heart's electrical activity. Doctors most often use these monitors to diagnose arrhythmias. Arrhythmias are problems with the speed or rhythm of the heartbeat. The monitor is a small, portable device. You can wear one while you do your normal daily activities. This is usually used to diagnose what is causing palpitations/syncope (passing out).  Follow-Up: Your physician wants to schedule a follow-up after we get your results.  Your  Physician would like you to check your blood pressure twice daily for one week and call our office with your results.  If you need a refill on your cardiac medications before your next appointment, please call your pharmacy.

## 2017-02-09 ENCOUNTER — Ambulatory Visit (HOSPITAL_COMMUNITY)
Admission: RE | Admit: 2017-02-09 | Discharge: 2017-02-09 | Disposition: A | Discharge status: Home / Self Care | Payer: Medicare HMO | Source: Ambulatory Visit | Attending: Internal Medicine | Admitting: Internal Medicine

## 2017-02-09 DIAGNOSIS — I429 Cardiomyopathy, unspecified: Secondary | ICD-10-CM | POA: Diagnosis not present

## 2017-02-09 DIAGNOSIS — I5042 Chronic combined systolic (congestive) and diastolic (congestive) heart failure: Secondary | ICD-10-CM | POA: Diagnosis present

## 2017-02-09 MED ORDER — GADOBENATE DIMEGLUMINE 529 MG/ML IV SOLN
40.0000 mL | Freq: Once | INTRAVENOUS | Status: AC
  Administered 2017-02-09: 38 mL via INTRAVENOUS

## 2017-02-16 ENCOUNTER — Telehealth: Payer: Self-pay | Admitting: *Deleted

## 2017-02-16 ENCOUNTER — Ambulatory Visit (INDEPENDENT_AMBULATORY_CARE_PROVIDER_SITE_OTHER): Payer: Medicare HMO

## 2017-02-16 DIAGNOSIS — I493 Ventricular premature depolarization: Secondary | ICD-10-CM | POA: Diagnosis not present

## 2017-02-16 NOTE — Telephone Encounter (Signed)
Patient requesting results for Cardiac MRI.

## 2017-02-17 NOTE — Telephone Encounter (Signed)
Pt's wife aware of cMRI results

## 2017-03-02 ENCOUNTER — Encounter (INDEPENDENT_AMBULATORY_CARE_PROVIDER_SITE_OTHER): Payer: Self-pay

## 2017-03-02 ENCOUNTER — Ambulatory Visit (INDEPENDENT_AMBULATORY_CARE_PROVIDER_SITE_OTHER): Payer: Medicare HMO | Admitting: Cardiology

## 2017-03-02 ENCOUNTER — Encounter: Payer: Self-pay | Admitting: Cardiology

## 2017-03-02 VITALS — BP 158/100 | HR 61 | Ht 71.0 in | Wt 213.4 lb

## 2017-03-02 DIAGNOSIS — I493 Ventricular premature depolarization: Secondary | ICD-10-CM | POA: Diagnosis not present

## 2017-03-02 DIAGNOSIS — I428 Other cardiomyopathies: Secondary | ICD-10-CM

## 2017-03-02 MED ORDER — AMIODARONE HCL 200 MG PO TABS: 200.0000 mg | ORAL_TABLET | Freq: Every day | ORAL | 11 refills | Status: DC

## 2017-03-02 NOTE — Patient Instructions (Addendum)
Medication Instructions:  Your physician recommends that you continue on your current medications as directed. Please refer to the Current Medication list given to you today.   Labwork: None ordered  Testing/Procedures: None ordered  Follow-Up: Your physician recommends that you schedule a follow-up appointment in: [place]  Your physician recommends that you schedule a follow-up appointment in: FOLLOW-UP AS PLANNED WITH DR. Haroldine Laws   Any Other Special Instructions Will Be Listed Below (If Applicable).     If you need a refill on your cardiac medications before your next appointment, please call your pharmacy.

## 2017-03-02 NOTE — Progress Notes (Signed)
03/02/2017 David Underwood   23-May-1951  [id number]  Primary Physician Center, [place] Primary Cardiologist: Dr. Radford Pax HF: Dr. Haroldine Laws  Reason for Visit/CC: Nonischemic Cardiomyopathy and PVCs  HPI:  David Underwood is a 66 y.o. male, who presents to clinic today for follow-up. He was recently diagnosed with nonischemic cardiomyopathy. 2D echocardiogram in May 2018 showed low EF of 20-25%. He underwent a left heart catheterization which showed nonobstructive CAD. He was noted during that time to have frequent PVCs. A 24-hour Holter monitor was placed in June which showed high PVC burden greater than 15%. TSH and electrolytes were within normal limits. His cardiomyopathy was felt to be tachycardia mediated secondary to high PVC burden and he was subsequently placed on PO amiodarone. He was also refered to the Advanced heart failure clinic and seen by Dr. Haroldine Laws. He was placed on heart failure therapy including beta blockers as well as a Enstreso however he did not tolerate these medications due to bradycardia and orthostatic hypotension.   In July, he was reevaluated with another 24-hour Holter monitor to see if PVC burden had improved with antiarrhythmic drug therapy. This did show improvement however his PVC burden was still high at 10%. These results were reviewed by Dr. Radford Pax and she recommended referral to EP for further recommendations and consideration for possible PVC ablation. This appointment has not yet been scheduled. He is also scheduled to undergo a sleep study to rule out obstructive sleep apnea. This is scheduled for later this month on August 15. The patient also has a follow-up appointment with the advanced heart failure clinic tomorrow.  In clinic today, he reports that he has done fairly well. He reports that he has been fully compliant with his medications however he did not take his morning medications prior to this appointment. His blood pressure is high at  158/100. Heart rate 61 bpm. From a symptom standpoint his only complaint is mild exertional fatigue. He denies any dizziness, syncope/near-syncope. He reports avoidance of caffeine. He is also avoiding salt. He denies any recent lower extremity edema, orthopnea or PND.    Current Meds  Medication Sig  . amiodarone (PACERONE) 200 MG tablet Take 1 tablet (200 mg total) by mouth daily.  Marland Kitchen aspirin 81 MG EC tablet Take 1 tablet (81 mg total) by mouth daily.  Marland Kitchen co-enzyme Q-10 30 MG capsule Take 30 mg by mouth daily.  Marland Kitchen losartan (COZAAR) 25 MG tablet Take 0.5 tablets (12.5 mg total) by mouth daily.  . nitroGLYCERIN (NITROSTAT) 0.4 MG SL tablet Place 1 tablet (0.4 mg total) under the tongue every 5 (five) minutes x 3 doses as needed for chest pain.  . pantoprazole (PROTONIX) 40 MG tablet Take 1 tablet (40 mg total) by mouth daily.  . rosuvastatin (CRESTOR) 20 MG tablet Take 20 mg by mouth at bedtime.   Marland Kitchen spironolactone (ALDACTONE) 25 MG tablet Take 0.5 tablets (12.5 mg total) by mouth daily.  . [DISCONTINUED] amiodarone (PACERONE) 200 MG tablet Take 1 tablet (200 mg total) by mouth daily.   Allergies  Allergen Reactions  . Aspirin Other (See Comments)    "stomach irritation", GI bleeding, also mentions colon CA   Past Medical History:  Diagnosis Date  . Back pain   . Chronic combined systolic and diastolic heart failure ([place]) 12/24/2016  . Colon cancer [facility])    s/p colostomy  . GERD (gastroesophageal reflux disease)    with associated esophagitis on PPI  . Hyperlipidemia   . Hypertension   .  Junctional rhythm   . Myocardial infarction ([place])   . NICM (nonischemic cardiomyopathy) ([place])   . Nonobstructive atherosclerosis of coronary artery    a. 12/21/16- Cath: non-obstructive CAD, EF <25%.  . Orthostatic hypotension   . PVC's (premature ventricular contractions)    Family History  Problem Relation Age of Onset  . Colon cancer Mother   . Deep vein thrombosis Father   . Heart disease  Brother   . Heart attack Brother    Past Surgical History:  Procedure Laterality Date  . COLON SURGERY    . LEFT HEART CATH AND CORONARY ANGIOGRAPHY N/A 12/21/2016   Procedure: Left Heart Cath and Coronary Angiography;  Surgeon: Martinique, Peter M, MD;  Location: [facility];  Service: Cardiovascular;  Laterality: N/A;   Social History   Social History  . Marital status: Married    Spouse name: N/A  . Number of children: N/A  . Years of education: N/A   Occupational History  . Not on file.   Social History Main Topics  . Smoking status: Never Smoker  . Smokeless tobacco: Never Used  . Alcohol use No  . Drug use: No  . Sexual activity: No   Other Topics Concern  . Not on file   Social History Narrative  . No narrative on file     Review of Systems: General: negative for chills, fever, night sweats or weight changes.  Cardiovascular: negative for chest pain, dyspnea on exertion, edema, orthopnea, palpitations, paroxysmal nocturnal dyspnea or shortness of breath Dermatological: negative for rash Respiratory: negative for cough or wheezing Urologic: negative for hematuria Abdominal: negative for nausea, vomiting, diarrhea, bright red blood per rectum, melena, or hematemesis Neurologic: negative for visual changes, syncope, or dizziness All other systems reviewed and are otherwise negative except as noted above.   Physical Exam:  Blood pressure (!) 158/100, pulse 61, height 5\' 11"  (1.803 m), weight 213 lb 6.4 oz (96.8 kg).  General appearance: alert, cooperative and no distress Neck: no carotid bruit and no JVD Lungs: clear to auscultation bilaterally Heart: regular rate and rhythm, S1, S2 normal, no murmur, click, rub or gallop Extremities: extremities normal, atraumatic, no cyanosis or edema Pulses: 2+ and symmetric Skin: Skin color, texture, turgor normal. No rashes or lesions Neurologic: Grossly normal  EKG not performed -- personally reviewed   ASSESSMENT  AND PLAN:   1. Nonischemic cardiomyopathy/ Systolic HF: EF 25-95%. Heart catheterization showed nonobstructive CAD. His cardiomyopathy is felt to be tachycardia mediated from high PVC burden. Continue amiodarone for now for PVCs and will also place referral for EP evaluation. Continue losartan and spironolactone for heart failure. He did not tolerate Entresto nor beta blockers previously. He is euvolemic on physical exam today and denies any significant dyspnea. Continue low-salt diet. He is also followed in the advanced heart failure clinic and has an appointment with them tomorrow.  2. PVCs: Despite treatment with amiodarone, repeat 24-hour Holter monitor revealed high PVC burden at 10%. Dr. Radford Pax has recommended referral to EP for further management and consideration for possible PVC ablation. We will place referral. Also due to get an outpatient sleep study to rule out sleep apnea as the cause of his PVCs.   3. Hypertension: Blood pressure is elevated at 158/100 today however the patient reports that he has not yet taken his morning medications which include losartan and spironolactone. Thus will not make medication adjustments today based on office blood pressure reading. Patient has an appointment in the advanced heart  failure clinic tomorrow and he was instructed to be sure to take his morning medicines prior to his appointment so they can reassess if his blood pressure is adequately controlled on his current regimen. If not, he might need further titration of either his losartan or spironolactone. We also discussed avoidance of salt.   David Underwood, MHS [facility] 03/02/2017 9:01 AM

## 2017-03-03 ENCOUNTER — Inpatient Hospital Stay (HOSPITAL_COMMUNITY): Admission: RE | Admit: 2017-03-03 | Payer: Medicare HMO | Source: Ambulatory Visit | Admitting: Internal Medicine

## 2017-03-09 ENCOUNTER — Ambulatory Visit (HOSPITAL_BASED_OUTPATIENT_CLINIC_OR_DEPARTMENT_OTHER): Payer: Medicare HMO | Attending: Internal Medicine | Admitting: Cardiology

## 2017-03-09 ENCOUNTER — Encounter: Payer: Self-pay | Admitting: Cardiology

## 2017-03-09 VITALS — Ht 71.0 in | Wt 217.0 lb

## 2017-03-09 DIAGNOSIS — G4733 Obstructive sleep apnea (adult) (pediatric): Secondary | ICD-10-CM

## 2017-03-09 DIAGNOSIS — R0683 Snoring: Secondary | ICD-10-CM | POA: Diagnosis not present

## 2017-03-10 NOTE — Procedures (Signed)
Patient Name: David Underwood, David Underwood Date: 03/09/2017 Gender: Male D.O.B: January 03, 1951 Age (years): 21 Referring Provider: Shaune Pascal Bensimhon Height (inches): 71 Interpreting Physician: Fransico Him MD, ABSM Weight (lbs): 217 RPSGT: Zadie Rhine BMI: 30 MRN: [id number] Neck Size: 16.00  CLINICAL INFORMATION Sleep Study Type: Split Night CPAP  Indication for sleep study: Snoring  Epworth Sleepiness Score: 2  SLEEP STUDY TECHNIQUE As per the AASM Manual for the Scoring of Sleep and Associated Events v2.3 (April 2016) with a hypopnea requiring 4% desaturations.  The channels recorded and monitored were frontal, central and occipital EEG, electrooculogram (EOG), submentalis EMG (chin), nasal and oral airflow, thoracic and abdominal wall motion, anterior tibialis EMG, snore microphone, electrocardiogram, and pulse oximetry. Continuous positive airway pressure (CPAP) was initiated when the patient met split night criteria and was titrated according to treat sleep-disordered breathing.  MEDICATIONS Medications self-administered by patient taken the night of the study : N/A  RESPIRATORY PARAMETERS Diagnostic Total AHI (/hr): 28.5  RDI (/hr):29.1  OA Index (/hr): 28  CA Index (/hr): 0.6 REM AHI (/hr): 7.7  NREM AHI (/hr):37.3  Supine AHI (/hr):28.5  Non-supine AHI (/hr):N/A Min O2 Sat (%):93.00  Mean O2 (%): 97.59  Time below 88% (min):0.0    Titration Optimal Pressure (cm):11  AHI at Optimal Pressure (/hr):0.0  Min O2 at Optimal Pressure (%):97.0 Supine % at Optimal (%):100  Sleep % at Optimal (%):99    SLEEP ARCHITECTURE The recording time for the entire night was 368.4 minutes.  During a baseline period of 146.4 minutes, the patient slept for 105.1 minutes in REM and nonREM, yielding a sleep efficiency of 71.8%. Sleep onset after lights out was 33.8 minutes with a REM latency of 78.5 minutes. The patient spent 2.85% of the night in stage N1 sleep, 67.56% in  stage N2 sleep, 0.00% in stage N3 and 29.58% in REM.  During the titration period of 216.8 minutes, the patient slept for 212.6 minutes in REM and nonREM, yielding a sleep efficiency of 98.0%. Sleep onset after CPAP initiation was 1.3 minutes with a REM latency of 85.5 minutes. The patient spent 2.12% of the night in stage N1 sleep, 69.62% in stage N2 sleep, 0.00% in stage N3 and 28.26% in REM.  CARDIAC DATA The 2 lead EKG demonstrated sinus rhythm. The mean heart rate was 62.63 beats per minute. Other EKG findings include: PVCs.  LEG MOVEMENT DATA The total Periodic Limb Movements of Sleep (PLMS) were 0. The PLMS index was 0.00 .  IMPRESSIONS - Moderate obstructive sleep apnea occurred during the diagnostic portion of the study(AHI = 28.5/hour). An optimal PAP pressure was selected for this patient ( 11 cm of water) - No significant central sleep apnea occurred during the diagnostic portion of the study (CAI = 0.6/hour). - The patient had minimal or no oxygen desaturation during the diagnostic portion of the study (Min O2 = 93.00%) - No snoring was audible during the diagnostic portion of the study. - EKG findings include PVCs. - Clinically significant periodic limb movements did not occur during sleep.  DIAGNOSIS - Obstructive Sleep Apnea (327.23 [G47.33 ICD-10])  RECOMMENDATIONS - Trial of CPAP therapy on 11 cm H2O with a Medium size Resmed Full Face Mask AirFit F20 mask and heated humidification. - Avoid alcohol, sedatives and other CNS depressants that may worsen sleep apnea and disrupt normal sleep architecture. - Sleep hygiene should be reviewed to assess factors that may improve sleep quality. - Weight management and regular exercise should be initiated or continued. -  Return to Sleep Center for re-evaluation after 10 weeks of therapy  [place], [facility]  ELECTRONICALLY SIGNED ON:  03/10/2017, 8:26 PM [place] PH:  [phone]   FX: [phone] [place]

## 2017-03-15 ENCOUNTER — Telehealth: Payer: Self-pay | Admitting: *Deleted

## 2017-03-15 NOTE — Telephone Encounter (Signed)
-----   Message from Traci R Turner, MD sent at 03/10/2017  8:30 PM EDT ----- Please let patient know that they have significant sleep apnea and had successful CPAP titration and will be set up with CPAP unit.  Please let DME know that order is in EPIC.  Please set patient up for OV in 10 weeks 

## 2017-03-15 NOTE — Telephone Encounter (Signed)
LMTCB

## 2017-03-16 NOTE — Telephone Encounter (Signed)
LMTCB

## 2017-03-17 NOTE — Telephone Encounter (Signed)
LMTCB

## 2017-03-17 NOTE — Telephone Encounter (Deleted)
-----   Message from Sueanne Margarita, MD sent at 03/10/2017  8:30 PM EDT ----- Please let patient know that they have significant sleep apnea and had successful CPAP titration and will be set up with CPAP unit.  Please let DME know that order is in EPIC.  Please set patient up for OV in 10 weeks

## 2017-03-18 ENCOUNTER — Ambulatory Visit (HOSPITAL_COMMUNITY)
Admission: RE | Admit: 2017-03-18 | Discharge: 2017-03-18 | Disposition: A | Discharge status: Home / Self Care | Payer: Medicare HMO | Source: Ambulatory Visit | Attending: Internal Medicine | Admitting: Internal Medicine

## 2017-03-18 VITALS — BP 152/90 | HR 60 | Wt 211.0 lb

## 2017-03-18 DIAGNOSIS — I10 Essential (primary) hypertension: Secondary | ICD-10-CM

## 2017-03-18 DIAGNOSIS — Z800 Family history of malignant neoplasm of digestive organs: Secondary | ICD-10-CM | POA: Insufficient documentation

## 2017-03-18 DIAGNOSIS — I429 Cardiomyopathy, unspecified: Secondary | ICD-10-CM | POA: Insufficient documentation

## 2017-03-18 DIAGNOSIS — Z85038 Personal history of other malignant neoplasm of large intestine: Secondary | ICD-10-CM | POA: Diagnosis not present

## 2017-03-18 DIAGNOSIS — K219 Gastro-esophageal reflux disease without esophagitis: Secondary | ICD-10-CM | POA: Insufficient documentation

## 2017-03-18 DIAGNOSIS — I493 Ventricular premature depolarization: Secondary | ICD-10-CM | POA: Insufficient documentation

## 2017-03-18 DIAGNOSIS — Z8249 Family history of ischemic heart disease and other diseases of the circulatory system: Secondary | ICD-10-CM | POA: Diagnosis not present

## 2017-03-18 DIAGNOSIS — E785 Hyperlipidemia, unspecified: Secondary | ICD-10-CM | POA: Insufficient documentation

## 2017-03-18 DIAGNOSIS — I951 Orthostatic hypotension: Secondary | ICD-10-CM | POA: Insufficient documentation

## 2017-03-18 DIAGNOSIS — Z933 Colostomy status: Secondary | ICD-10-CM | POA: Diagnosis not present

## 2017-03-18 DIAGNOSIS — I5042 Chronic combined systolic (congestive) and diastolic (congestive) heart failure: Secondary | ICD-10-CM | POA: Diagnosis not present

## 2017-03-18 DIAGNOSIS — I2510 Atherosclerotic heart disease of native coronary artery without angina pectoris: Secondary | ICD-10-CM | POA: Insufficient documentation

## 2017-03-18 DIAGNOSIS — N183 Chronic kidney disease, stage 3 (moderate): Secondary | ICD-10-CM | POA: Insufficient documentation

## 2017-03-18 DIAGNOSIS — Z7982 Long term (current) use of aspirin: Secondary | ICD-10-CM | POA: Insufficient documentation

## 2017-03-18 DIAGNOSIS — R0683 Snoring: Secondary | ICD-10-CM | POA: Insufficient documentation

## 2017-03-18 DIAGNOSIS — I252 Old myocardial infarction: Secondary | ICD-10-CM | POA: Diagnosis not present

## 2017-03-18 DIAGNOSIS — I130 Hypertensive heart and chronic kidney disease with heart failure and stage 1 through stage 4 chronic kidney disease, or unspecified chronic kidney disease: Secondary | ICD-10-CM | POA: Insufficient documentation

## 2017-03-18 DIAGNOSIS — I428 Other cardiomyopathies: Secondary | ICD-10-CM

## 2017-03-18 LAB — BASIC METABOLIC PANEL
Anion gap: 7 (ref 5–15)
BUN: 10 mg/dL (ref 6–20)
CHLORIDE: 111 mmol/L (ref 101–111)
CO2: 23 mmol/L (ref 22–32)
Calcium: 9.3 mg/dL (ref 8.9–10.3)
Creatinine, Ser: 1.46 mg/dL — ABNORMAL HIGH (ref 0.61–1.24)
GFR calc non Af Amer: 49 mL/min — ABNORMAL LOW (ref >60)
GFR, EST AFRICAN AMERICAN: 56 mL/min — AB (ref >60)
GLUCOSE: 93 mg/dL (ref 65–99)
POTASSIUM: 3.9 mmol/L (ref 3.5–5.1)
Sodium: 141 mmol/L (ref 135–145)

## 2017-03-18 MED ORDER — SPIRONOLACTONE 25 MG PO TABS: 25.0000 mg | ORAL_TABLET | Freq: Every day | ORAL | 6 refills | Status: DC

## 2017-03-18 NOTE — Progress Notes (Signed)
Advanced Heart Failure Clinic Note    Primary Care: [facility] Primary Cardiologist: Dr. Radford Pax   HPI: David Underwood is a 66 y.o. male with history of GERD, colon CA s/p colostomy, HTN, CKD stage III, recently diagnosed NICM, nonobstructive CAD, PVCs, orthostatic hypotension and junctional rhythm. Also with history of newly diagnosed systolic CHF, echo in 11/4006 with EF 20-25% grade 2 DD, mild AI, mild-mod MR, mod TR, PASP 40.   Admitted 12/19/16-12/22/16 with DOE, nausea and vomiting. Troponin 0.26, EKG showed sinus bradycardia with diffuse T wave abnormalities c/w inferolateral ischemia. Left heart cath showed nonobstructive CAD. Noted to have frequent PVC's on tele. He was started on Entresto at discharge.   Admitted 12/23/16-12/27/16 with near syncope, nausea and SOB. He was orthostatic sitting BP 136/95, standing 91/56. His beta blocker was stopped because of bradycardia and some junctional rhythms noted on tele. Frequent PVC's continued on tele. His Entresto was stopped. Discharge weight was 208 pounds.   He presents today for regular follow up.  At last visit spiro started. Weight down about 2 lbs since that visit. Feeling good overall.  Can push the mower for about an hour before having to stop. Denies orthopnea or PND. Sleep study 03/09/17, no results yet. Denies any chest pain. Denies lightheadedness or dizziness. Taking all medications as directed. No marked periphearl edema.   cMRI 02/09/17 LVEF 20%, Normal RV, No LGE.   Review of systems complete and found to be negative unless listed in HPI.    Past Medical History:  Diagnosis Date  . Back pain   . Chronic combined systolic and diastolic heart failure ([place]) 12/24/2016  . Colon cancer [facility])    s/p colostomy  . GERD (gastroesophageal reflux disease)    with associated esophagitis on PPI  . Hyperlipidemia   . Hypertension   . Junctional rhythm   . Myocardial infarction ([place])   . NICM (nonischemic cardiomyopathy)  ([facility])   . Nonobstructive atherosclerosis of coronary artery    a. 12/21/16- Cath: non-obstructive CAD, EF <25%.  . Orthostatic hypotension   . PVC's (premature ventricular contractions)     Current Outpatient Prescriptions  Medication Sig Dispense Refill  . amiodarone (PACERONE) 200 MG tablet Take 1 tablet (200 mg total) by mouth daily. 30 tablet 11  . aspirin 81 MG EC tablet Take 1 tablet (81 mg total) by mouth daily. 30 tablet 11  . co-enzyme Q-10 30 MG capsule Take 30 mg by mouth daily.    Marland Kitchen losartan (COZAAR) 25 MG tablet Take 0.5 tablets (12.5 mg total) by mouth daily. 16 tablet 6  . nitroGLYCERIN (NITROSTAT) 0.4 MG SL tablet Place 1 tablet (0.4 mg total) under the tongue every 5 (five) minutes x 3 doses as needed for chest pain. 25 tablet 12  . pantoprazole (PROTONIX) 40 MG tablet Take 1 tablet (40 mg total) by mouth daily. 30 tablet 5  . rosuvastatin (CRESTOR) 20 MG tablet Take 20 mg by mouth at bedtime.     Marland Kitchen spironolactone (ALDACTONE) 25 MG tablet Take 0.5 tablets (12.5 mg total) by mouth daily. 15 tablet 3   No current facility-administered medications for this encounter.     Allergies  Allergen Reactions  . Aspirin Other (See Comments)    "stomach irritation", GI bleeding, also mentions colon CA      Social History   Social History  . Marital status: Married    Spouse name: N/A  . Number of children: N/A  . Years of education: N/A  Occupational History  . Not on file.   Social History Main Topics  . Smoking status: Never Smoker  . Smokeless tobacco: Never Used  . Alcohol use No  . Drug use: No  . Sexual activity: No   Other Topics Concern  . Not on file   Social History Narrative  . No narrative on file      Family History  Problem Relation Age of Onset  . Colon cancer Mother   . Deep vein thrombosis Father   . Heart disease Brother   . Heart attack Brother     Vitals:   03/18/17 0855  BP: (!) 152/90  Pulse: 60  SpO2: 98%  Weight: 211 lb  (95.7 kg)   Wt Readings from Last 3 Encounters:  03/18/17 211 lb (95.7 kg)  03/09/17 217 lb (98.4 kg)  03/02/17 213 lb 6.4 oz (96.8 kg)    PHYSICAL EXAM: General: Well appearing. No resp difficulty. HEENT: Normal Neck: Supple. JVP 7-8 cm. Carotids 2+ bilat; no bruits. No thyromegaly or nodule noted. Cor: PMI nondisplaced. RRR, No M/G/R noted Lungs: CTAB, normal effort. Abdomen: Soft, non-tender, non-distended, no HSM. No bruits or masses. +BS  Extremities: No cyanosis, clubbing, or rash. Trace ankle edema at most.  Neuro: Alert & orientedx3, cranial nerves grossly intact. moves all 4 extremities w/o difficulty. Affect pleasant   ASSESSMENT & PLAN:  1. Acute on chronic systolic HF - due to NICM. EF  20-25%. Cath 5/18. Non-obstructive CAD - cMRI wit LVEF 20%, normal RV, and no LGE - NYHA class II symptoms - Volume status stable on exam.  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 BMET today.  Repeat labs in 10 days.   - Continue losartan 12.5 daily. Did not tolerate entresto 24/26 with hypotension.  - No b-blocker yet. Consider at EP visit if appropriate.  - Reinforced fluid restriction to < 2 L daily, sodium restriction to less than 2000 mg daily, and the importance of daily weights.    2. CAD, nonobstructive - Continue ASA and statin. Denies CP.   3. Snoring - Results pending.   4. PVCs - Burden > 10% on amio via Holter monitor 02/18/17. Has appt with EP in 2 weeks.   4. HTN - Meds as above. Titrating gently with previous intolerance.   Increasing spiro. Labs today and repeat 10 days. Follow up with EP scheduled, sleep study pending. RTC 2 months.   Shirley Friar, PA-C  9:15 AM  Greater than 50% of the 25 minute visit was spent in counseling/coordination of care regarding disease state education, sliding scale diuretics, salt/fluid restriction, and medication reconciliation.

## 2017-03-18 NOTE — Patient Instructions (Addendum)
Routine lab work today. Will notify you of abnormal results, otherwise no news is good news!  INCREASE Spironolactone to 25 mg (1 whole tablet) once daily at bedtime.  Return in 2 weeks to repeat labs.  ____________________________________________________________  ____________________________________________________________  Follow up 2 months with Dr. Haroldine Laws.  ____________________________________________________________  ____________________________________________________________  Take all medication as prescribed the day of your appointment. Bring all medications with you to your appointment.  Do the following things EVERYDAY: 1) Weigh yourself in the morning before breakfast. Write it down and keep it in a log. 2) Take your medicines as prescribed 3) Eat low salt foods-Limit salt (sodium) to 2000 mg per day.  4) Stay as active as you can everyday 5) Limit all fluids for the day to less than 2 liters

## 2017-03-23 NOTE — Telephone Encounter (Deleted)
-----   Message from Sueanne Margarita, MD sent at 03/10/2017  8:30 PM EDT ----- Please let patient know that they have significant sleep apnea and had successful CPAP titration and will be set up with CPAP unit.  Please let DME know that order is in EPIC.  Please set patient up for OV in 10 weeks

## 2017-03-23 NOTE — Telephone Encounter (Signed)
Informed patient of titration results and verbalized understanding was indicated. Patient understands his CPAP titration was successful. Patient understands Dr Radford Pax has ordered him a CPAP unit in epic. Patient understands he will be contacted by [facility] to set up his cpap. He understands to call if [facility] does not contact him with new setup in a timely manner. He understands he will be called once confirmation has been received from [facility] that he has received his new machine to schedule 10 week follow up appointment.  [facility] notified of new cpap order  Please add to David Underwood He was grateful for the call and thanked me

## 2017-03-28 ENCOUNTER — Other Ambulatory Visit (HOSPITAL_COMMUNITY): Payer: Medicare HMO

## 2017-03-29 NOTE — Telephone Encounter (Signed)
Confirmation of all information received by Randall Hiss at [facility].

## 2017-03-30 ENCOUNTER — Ambulatory Visit (HOSPITAL_COMMUNITY)
Admission: RE | Admit: 2017-03-30 | Discharge: 2017-03-30 | Disposition: A | Discharge status: Home / Self Care | Payer: Medicare HMO | Source: Ambulatory Visit | Attending: Internal Medicine | Admitting: Internal Medicine

## 2017-03-30 ENCOUNTER — Ambulatory Visit (INDEPENDENT_AMBULATORY_CARE_PROVIDER_SITE_OTHER): Payer: Medicare HMO | Admitting: Cardiology

## 2017-03-30 ENCOUNTER — Encounter: Payer: Self-pay | Admitting: Cardiology

## 2017-03-30 VITALS — BP 124/78 | HR 57 | Ht 71.0 in | Wt 211.6 lb

## 2017-03-30 DIAGNOSIS — I493 Ventricular premature depolarization: Secondary | ICD-10-CM

## 2017-03-30 DIAGNOSIS — G4733 Obstructive sleep apnea (adult) (pediatric): Secondary | ICD-10-CM | POA: Diagnosis not present

## 2017-03-30 DIAGNOSIS — I5042 Chronic combined systolic (congestive) and diastolic (congestive) heart failure: Secondary | ICD-10-CM | POA: Diagnosis not present

## 2017-03-30 DIAGNOSIS — I428 Other cardiomyopathies: Secondary | ICD-10-CM | POA: Diagnosis not present

## 2017-03-30 DIAGNOSIS — I2510 Atherosclerotic heart disease of native coronary artery without angina pectoris: Secondary | ICD-10-CM

## 2017-03-30 LAB — BASIC METABOLIC PANEL
Anion gap: 8 (ref 5–15)
BUN: 11 mg/dL (ref 6–20)
CO2: 21 mmol/L — AB (ref 22–32)
Calcium: 9.2 mg/dL (ref 8.9–10.3)
Chloride: 111 mmol/L (ref 101–111)
Creatinine, Ser: 1.55 mg/dL — ABNORMAL HIGH (ref 0.61–1.24)
GFR calc Af Amer: 53 mL/min — ABNORMAL LOW (ref >60)
GFR, EST NON AFRICAN AMERICAN: 45 mL/min — AB (ref >60)
GLUCOSE: 85 mg/dL (ref 65–99)
Potassium: 3.8 mmol/L (ref 3.5–5.1)
Sodium: 140 mmol/L (ref 135–145)

## 2017-03-30 NOTE — Patient Instructions (Signed)
Medication Instructions:  Your physician recommends that you continue on your current medications as directed. Please refer to the Current Medication list given to you today.  If you need a refill on your cardiac medications before your next appointment, please call your pharmacy.   Labwork: None ordered  Testing/Procedures: Your physician has recommended that you wear a 48 hour holter monitor in one month -- prior to seeing Dr. Curt Bears in follow up. Holter monitors are medical devices that record the heart's electrical activity. Doctors most often use these monitors to diagnose arrhythmias. Arrhythmias are problems with the speed or rhythm of the heartbeat. The monitor is a small, portable device. You can wear one while you do your normal daily activities. This is usually used to diagnose what is causing palpitations/syncope (passing out).  Follow-Up: Your physician recommends that you schedule a follow-up appointment in: 1 month with Dr. Curt Bears (after holter monitor is completed)  Thank you for choosing [facility]!!   Trinidad Curet, RN [phone]

## 2017-03-30 NOTE — Progress Notes (Signed)
Electrophysiology Office Note   Date:  03/30/2017   ID:  David, Underwood 1951/07/23, MRN [id number]  PCP:  David Underwood  Cardiologist:  Radford Pax Primary Electrophysiologist:  Cote Mayabb Meredith Leeds, MD    Chief Complaint  Patient presents with  . Advice Only    PVC's/Discuss ablation     History of Present Illness: David Underwood is a 66 y.o. male who is being seen today for the evaluation of CHF, PVCs at the request of Center, [facility]. Presenting today for electrophysiology evaluation. He has a history of GERD, colon cancer status post colostomy, hypertension, CKG stage III, recently diagnosed nonischemic cardiomyopathy, nonobstructive coronary disease, PVCs, orthostatic hypotension, and junctional rhythm. Echo 11/2016 showed an EF of 20-25%.   He was admitted on 12/19/2016 with dyspnea on exertion nausea and vomiting. Troponin was 0.26. EKG showed sinus bradycardia with T-wave abnormalities in inferolateral ischemia. Heart catheterization showed nonobstructive coronary disease. He was started on [facility] discharge.  Today, he denies symptoms of palpitations, chest pain, shortness of breath, orthopnea, PND, lower extremity edema, claudication, dizziness, presyncope, syncope, bleeding, or neurologic sequela. The patient is tolerating medications without difficulties.    Past Medical History:  Diagnosis Date  . Back pain   . Chronic combined systolic and diastolic heart failure ([place]) 12/24/2016  . Colon cancer [facility])    s/p colostomy  . GERD (gastroesophageal reflux disease)    with associated esophagitis on PPI  . Hyperlipidemia   . Hypertension   . Junctional rhythm   . Myocardial infarction ([place])   . NICM (nonischemic cardiomyopathy) ([place])   . Nonobstructive atherosclerosis of coronary artery    a. 12/21/16- Cath: non-obstructive CAD, EF <25%.  . Orthostatic hypotension   . PVC's (premature ventricular contractions)    Past Surgical History:    Procedure Laterality Date  . COLON SURGERY    . LEFT HEART CATH AND CORONARY ANGIOGRAPHY N/A 12/21/2016   Procedure: Left Heart Cath and Coronary Angiography;  Surgeon: Martinique, Peter M, MD;  Location: [place] [facility];  Service: Cardiovascular;  Laterality: N/A;     Current Outpatient Prescriptions  Medication Sig Dispense Refill  . amiodarone (PACERONE) 200 MG tablet Take 1 tablet (200 mg total) by mouth daily. 30 tablet 11  . aspirin 81 MG EC tablet Take 1 tablet (81 mg total) by mouth daily. 30 tablet 11  . co-enzyme Q-10 30 MG capsule Take 30 mg by mouth daily.    Marland Kitchen losartan (COZAAR) 25 MG tablet Take 0.5 tablets (12.5 mg total) by mouth daily. 16 tablet 6  . nitroGLYCERIN (NITROSTAT) 0.4 MG SL tablet Place 1 tablet (0.4 mg total) under the tongue every 5 (five) minutes x 3 doses as needed for chest pain. 25 tablet 12  . pantoprazole (PROTONIX) 40 MG tablet Take 1 tablet (40 mg total) by mouth daily. 30 tablet 5  . rosuvastatin (CRESTOR) 20 MG tablet Take 20 mg by mouth daily.    Marland Kitchen spironolactone (ALDACTONE) 25 MG tablet Take 1 tablet (25 mg total) by mouth at bedtime. 30 tablet 6   No current facility-administered medications for this visit.     Allergies:   Aspirin   Social History:  The patient  reports that he has never smoked. He has never used smokeless tobacco. He reports that he does not drink alcohol or use drugs.   Family History:  The patient's family history includes Colon cancer in his mother; Deep vein thrombosis in his father; Heart attack in his brother;  Heart disease in his brother.    ROS:  Please see the history of present illness.   Otherwise, review of systems is positive for none.   All other systems are reviewed and negative.    PHYSICAL EXAM: VS:  BP 124/78   Pulse (!) 57   Ht 5\' 11"  (1.803 m)   Wt 211 lb 9.6 oz (96 kg)   SpO2 97%   BMI 29.51 kg/m  , BMI Body mass index is 29.51 kg/m. GEN: Well nourished, well developed, in no acute distress   HEENT: normal  Neck: no JVD, carotid bruits, or masses Cardiac: RRR; no murmurs, rubs, or gallops,no edema  Respiratory:  clear to auscultation bilaterally, normal work of breathing GI: soft, nontender, nondistended, + BS MS: no deformity or atrophy  Skin: warm and dry, Neuro:  Strength and sensation are intact Psych: euthymic mood, full affect  EKG:  EKG is ordered today. Personal review of the ekg ordered shows SR, rate 57, lateral TWI, QTc 494 msec  Recent Labs: 12/19/2016: B Natriuretic Peptide 445.3 12/20/2016: TSH 1.197 12/22/2016: Hemoglobin 16.6; Platelets 190 12/25/2016: Magnesium 2.1 02/08/2017: ALT 18 03/18/2017: BUN 10; Creatinine, Ser 1.46; Potassium 3.9; Sodium 141    Lipid Panel     Component Value Date/Time   CHOL 119 02/08/2017 0902   TRIG 61 02/08/2017 0902   HDL 54 02/08/2017 0902   CHOLHDL 2.2 02/08/2017 0902   CHOLHDL 3.5 12/20/2016 0454   VLDL 11 12/20/2016 0454   LDLCALC 53 02/08/2017 0902     Wt Readings from Last 3 Encounters:  03/30/17 211 lb 9.6 oz (96 kg)  03/18/17 211 lb (95.7 kg)  03/09/17 217 lb (98.4 kg)      Other studies Reviewed: Additional studies/ records that were reviewed today include: TTE 12/20/16  Review of the above records today demonstrates:  - Left ventricle: The cavity size was mildly dilated. Wall   thickness was normal. Systolic function was severely reduced. The   estimated ejection fraction was in the range of 20% to 25%.   Diffuse hypokinesis. Features are consistent with a pseudonormal   left ventricular filling pattern, with concomitant abnormal   relaxation and increased filling pressure (grade 2 diastolic   dysfunction). - Aortic valve: There was mild regurgitation. - Mitral valve: There was mild to moderate regurgitation. - Right atrium: The atrium was mildly dilated. - Tricuspid valve: There was moderate regurgitation. - Pulmonary arteries: Systolic pressure was moderately increased.   PA peak pressure: 40 mm  Hg (S).  Cath 12/21/16  Mid RCA lesion, 30 %stenosed.  RPDA-2 lesion, 50 %stenosed.  RPDA-1 lesion, 50 %stenosed.  Prox LAD to Mid LAD lesion, 30 %stenosed.  Ost 2nd Mrg to 2nd Mrg lesion, 15 %stenosed.  There is severe left ventricular systolic dysfunction.  LV end diastolic pressure is normal.  The left ventricular ejection fraction is less than 25% by visual estimate.   1. Nonobstructive CAD 2. Severe LV dysfunction 3. Normal LVEDP  Holter 02/22/17 - personally reviewed  Normal sinus rhythm and sinus tachycardia. The average heart rate was 72bpm and the heart rate ranged from 45-119bpm.  Frequent PVCs, ventricular couplets, ventricular bigeminy, multifocal ventricular salvos.  PVC load was 10%  CMRI 02/09/17 1. Mildly dilated LV with mild concentric LVH. Diffuse hypokinesis with EF 20%.  2.  Normal RV size and systolic function.  3. No myocardial LGE, so no definitive evidence for prior MI, infiltrative disease, or myocarditis.  ASSESSMENT AND PLAN:  1.  Nonischemic  cardiomyopathy: Currently on Aldactone, losartan. No beta blocker due to orthostatic hypotension.  2. Obstructive sleep apnea: Encouraged CPAP compliance  3. Nonobstructive coronary artery disease: No current chest pain. Continue current management.  4. 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 Holter monitor showed 10% PVCs on amiodarone. PVCs did decrease from 15%. Discussed with him the risks and benefits of ablation. Risks include bleeding, tamponade, heart block, and stroke, among others. He understands these risks and would like to think about the procedure. In the meantime, he would like to continue his amiodarone to see if this Jandi Swiger help with his PVC burden. We'll order a 48 hour monitor prior to our next visit. It does appear that his PVCs are coming from the left ventricular base.    Current medicines are reviewed at length with the patient today.   The patient does not have concerns regarding his  medicines.  The following changes were made today:  none  Labs/ tests ordered today include:  Orders Placed This Encounter  Procedures  . Holter monitor - 48 hour  . EKG 12-Lead     Disposition:   FU with Inessa Wardrop 1 month  Signed, Rishikesh Khachatryan Meredith Leeds, MD  03/30/2017 11:15 AM     [facility] [address] [place] [address] [phone] (office) [phone] (fax)

## 2017-05-02 ENCOUNTER — Ambulatory Visit: Payer: Medicare HMO | Admitting: Cardiology

## 2017-05-06 ENCOUNTER — Ambulatory Visit (INDEPENDENT_AMBULATORY_CARE_PROVIDER_SITE_OTHER): Payer: Medicare HMO

## 2017-05-06 DIAGNOSIS — I493 Ventricular premature depolarization: Secondary | ICD-10-CM | POA: Diagnosis not present

## 2017-05-17 NOTE — Progress Notes (Signed)
Electrophysiology Office Note   Date:  05/18/2017   ID:  David Underwood, Berg [birth year], MRN [id number]  PCP:  [place]  Cardiologist:  Radford Pax Primary Electrophysiologist:  Shernell Saldierna Meredith Leeds, MD    Chief Complaint  Patient presents with  . Follow-up    PVC's     History of Present Illness: David Underwood is a 66 y.o. male who is being seen today for the evaluation of CHF, PVCs at the request of Center, [facility]. Presenting today for electrophysiology evaluation. He has a history of GERD, colon cancer status post colostomy, hypertension, CKG stage III, recently diagnosed nonischemic cardiomyopathy, nonobstructive coronary disease, PVCs, orthostatic hypotension, and junctional rhythm. Echo 11/2016 showed an EF of 20-25%.   He was admitted on 12/19/2016 with dyspnea on exertion nausea and vomiting. Troponin was 0.26. EKG showed sinus bradycardia with T-wave abnormalities in inferolateral ischemia. Heart catheterization showed nonobstructive coronary disease. He was started on [facility] discharge.  Today, denies symptoms of palpitations, chest pain, shortness of breath, orthopnea, PND, lower extremity edema, claudication, dizziness, presyncope, syncope, bleeding, or neurologic sequela. The patient is tolerating medications without difficulties. He is feeling well without major complaints. He wore a Holter monitor that showed his PVC burden was down to 5%. He is able to do all his daily activities without issue.    Past Medical History:  Diagnosis Date  . Back pain   . Chronic combined systolic and diastolic heart failure ([place]) 12/24/2016  . Colon cancer [facility])    s/p colostomy  . GERD (gastroesophageal reflux disease)    with associated esophagitis on PPI  . Hyperlipidemia   . Hypertension   . Junctional rhythm   . Myocardial infarction ([place])   . NICM (nonischemic cardiomyopathy) ([address])   . Nonobstructive atherosclerosis of coronary artery    a.  12/21/16- Cath: non-obstructive CAD, EF <25%.  . Orthostatic hypotension   . PVC's (premature ventricular contractions)    Past Surgical History:  Procedure Laterality Date  . COLON SURGERY    . LEFT HEART CATH AND CORONARY ANGIOGRAPHY N/A 12/21/2016   Procedure: Left Heart Cath and Coronary Angiography;  Surgeon: Martinique, Peter M, MD;  Location: [place] [facility];  Service: Cardiovascular;  Laterality: N/A;     Current Outpatient Prescriptions  Medication Sig Dispense Refill  . amiodarone (PACERONE) 200 MG tablet Take 1 tablet (200 mg total) by mouth daily. 30 tablet 11  . aspirin 81 MG EC tablet Take 1 tablet (81 mg total) by mouth daily. 30 tablet 11  . co-enzyme Q-10 30 MG capsule Take 30 mg by mouth daily.    Marland Kitchen losartan (COZAAR) 25 MG tablet Take 0.5 tablets (12.5 mg total) by mouth daily. 16 tablet 6  . nitroGLYCERIN (NITROSTAT) 0.4 MG SL tablet Place 1 tablet (0.4 mg total) under the tongue every 5 (five) minutes x 3 doses as needed for chest pain. 25 tablet 12  . pantoprazole (PROTONIX) 40 MG tablet Take 1 tablet (40 mg total) by mouth daily. 30 tablet 5  . rosuvastatin (CRESTOR) 20 MG tablet Take 20 mg by mouth daily.    Marland Kitchen spironolactone (ALDACTONE) 25 MG tablet Take 1 tablet (25 mg total) by mouth at bedtime. 30 tablet 6   No current facility-administered medications for this visit.     Allergies:   Aspirin   Social History:  The patient  reports that he has never smoked. He has never used smokeless tobacco. He reports that he does not drink alcohol or  use drugs.   Family History:  The patient's family history includes Colon cancer in his mother; Deep vein thrombosis in his father; Heart attack in his brother; Heart disease in his brother.    ROS:  Please see the history of present illness.   Otherwise, review of systems is positive for none.   All other systems are reviewed and negative.   PHYSICAL EXAM: VS:  BP (!) 164/102 (BP Location: Left Arm)   Pulse 64   Ht 5\' 11"   (1.803 m)   Wt 213 lb 12.8 oz (97 kg)   SpO2 98%   BMI 29.82 kg/m  , BMI Body mass index is 29.82 kg/m. GEN: Well nourished, well developed, in no acute distress  HEENT: normal  Neck: no JVD, carotid bruits, or masses Cardiac: RRR; no murmurs, rubs, or gallops,no edema  Respiratory:  clear to auscultation bilaterally, normal work of breathing GI: soft, nontender, nondistended, + BS MS: no deformity or atrophy  Skin: warm and dry Neuro:  Strength and sensation are intact Psych: euthymic mood, full affect  EKG:  EKG is not ordered today. Personal review of the ekg ordered 03/30/17 shows SR, inferolateral T wave changes, prolonged QTc   Recent Labs: 12/19/2016: B Natriuretic Peptide 445.3 12/20/2016: TSH 1.197 12/22/2016: Hemoglobin 16.6; Platelets 190 12/25/2016: Magnesium 2.1 02/08/2017: ALT 18 03/30/2017: BUN 11; Creatinine, Ser 1.55; Potassium 3.8; Sodium 140    Lipid Panel     Component Value Date/Time   CHOL 119 02/08/2017 0902   TRIG 61 02/08/2017 0902   HDL 54 02/08/2017 0902   CHOLHDL 2.2 02/08/2017 0902   CHOLHDL 3.5 12/20/2016 0454   VLDL 11 12/20/2016 0454   LDLCALC 53 02/08/2017 0902     Wt Readings from Last 3 Encounters:  05/18/17 213 lb 12.8 oz (97 kg)  03/30/17 211 lb 9.6 oz (96 kg)  03/18/17 211 lb (95.7 kg)      Other studies Reviewed: Additional studies/ records that were reviewed today include: TTE 12/20/16  Review of the above records today demonstrates:  - Left ventricle: The cavity size was mildly dilated. Wall   thickness was normal. Systolic function was severely reduced. The   estimated ejection fraction was in the range of 20% to 25%.   Diffuse hypokinesis. Features are consistent with a pseudonormal   left ventricular filling pattern, with concomitant abnormal   relaxation and increased filling pressure (grade 2 diastolic   dysfunction). - Aortic valve: There was mild regurgitation. - Mitral valve: There was mild to moderate regurgitation. -  Right atrium: The atrium was mildly dilated. - Tricuspid valve: There was moderate regurgitation. - Pulmonary arteries: Systolic pressure was moderately increased.   PA peak pressure: 40 mm Hg (S).  Cath 12/21/16  Mid RCA lesion, 30 %stenosed.  RPDA-2 lesion, 50 %stenosed.  RPDA-1 lesion, 50 %stenosed.  Prox LAD to Mid LAD lesion, 30 %stenosed.  Ost 2nd Mrg to 2nd Mrg lesion, 15 %stenosed.  There is severe left ventricular systolic dysfunction.  LV end diastolic pressure is normal.  The left ventricular ejection fraction is less than 25% by visual estimate.   1. Nonobstructive CAD 2. Severe LV dysfunction 3. Normal LVEDP  Holter 02/22/17 - personally reviewed  Normal sinus rhythm and sinus tachycardia. The average heart rate was 72bpm and the heart rate ranged from 45-119bpm.  Frequent PVCs, ventricular couplets, ventricular bigeminy, multifocal ventricular salvos.  PVC load was 10%  CMRI 02/09/17 1. Mildly dilated LV with mild concentric LVH. Diffuse hypokinesis with  EF 20%.  2.  Normal RV size and systolic function.  3. No myocardial LGE, so no definitive evidence for prior MI, infiltrative disease, or myocarditis.  Holter 05/16/17 - personally reviewed Minimum HR: 40 BPM at 4:17:37 AM(2) Maximum HR: 128 BPM at 4:21:48 PM Average HR: 68 BPM 5% PVCs <1% APCs Sinus rhythm Zero atrial fibrillation  ASSESSMENT AND PLAN:  1.  Nonischemic cardiomyopathy: Currently on Aldactone and losartan. No beta blocker due to orthostatic hypotension. Continue current management.  2. Obstructive sleep apnea: Encouraged CPAP compliance  3. Nonobstructive coronary artery diseaseno current chest pain, continue current management  4. 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 Were repeat monitor that showed PVC burden had decreased to 5%. He is feeling well without major complaint. No changes at this time.     Current medicines are reviewed at length with the patient today.   The  patient does not have concerns regarding his medicines.  The following changes were made today:  None  Labs/ tests ordered today include:  Orders Placed This Encounter  Procedures  . TSH  . Hepatic function panel     Disposition:   FU with Kaye Mitro 6  month  Signed, Treylon Henard Meredith Leeds, MD  05/18/2017 11:13 AM     [facility] [address] [place] [address] [phone] (office) [phone] (fax)

## 2017-05-18 ENCOUNTER — Ambulatory Visit: Payer: Medicare HMO | Admitting: Cardiology

## 2017-05-18 ENCOUNTER — Encounter: Payer: Self-pay | Admitting: Cardiology

## 2017-05-18 ENCOUNTER — Ambulatory Visit (INDEPENDENT_AMBULATORY_CARE_PROVIDER_SITE_OTHER): Payer: Medicare HMO | Admitting: Cardiology

## 2017-05-18 VITALS — BP 164/102 | HR 64 | Ht 71.0 in | Wt 213.8 lb

## 2017-05-18 DIAGNOSIS — I428 Other cardiomyopathies: Secondary | ICD-10-CM

## 2017-05-18 DIAGNOSIS — Z79899 Other long term (current) drug therapy: Secondary | ICD-10-CM | POA: Diagnosis not present

## 2017-05-18 DIAGNOSIS — I2510 Atherosclerotic heart disease of native coronary artery without angina pectoris: Secondary | ICD-10-CM

## 2017-05-18 DIAGNOSIS — I493 Ventricular premature depolarization: Secondary | ICD-10-CM

## 2017-05-18 NOTE — Patient Instructions (Addendum)
Medication Instructions:  Your physician recommends that you continue on your current medications as directed. Please refer to the Current Medication list given to you today.  Labwork: Your physician recommends that you return for lab work in: November for Amiodarone surveillance blood work: TSH & LFTs  Testing/Procedures: None ordered  Follow-Up: Your physician wants you to follow-up in: 6 months with Dr. Curt Bears.  You will receive a reminder letter in the mail two months in advance. If you don't receive a letter, please call our office to schedule the follow-up appointment.  -- If you need a refill on your cardiac medications before your next appointment, please call your pharmacy. --  Thank you for choosing [facility]!!   Trinidad Curet, RN [phone]  Any Other Special Instructions Will Be Listed Below (If Applicable). Please keep track of your blood pressure at home.  Please call the office in several weeks to report those numbers.

## 2017-05-19 ENCOUNTER — Ambulatory Visit (HOSPITAL_COMMUNITY)
Admission: RE | Admit: 2017-05-19 | Discharge: 2017-05-19 | Disposition: A | Discharge status: Home / Self Care | Payer: Medicare HMO | Source: Ambulatory Visit | Attending: Internal Medicine | Admitting: Internal Medicine

## 2017-05-19 ENCOUNTER — Other Ambulatory Visit (HOSPITAL_COMMUNITY): Payer: Self-pay | Admitting: Internal Medicine

## 2017-05-19 VITALS — BP 179/102 | HR 68 | Wt 213.0 lb

## 2017-05-19 DIAGNOSIS — I429 Cardiomyopathy, unspecified: Secondary | ICD-10-CM | POA: Insufficient documentation

## 2017-05-19 DIAGNOSIS — R0602 Shortness of breath: Secondary | ICD-10-CM | POA: Insufficient documentation

## 2017-05-19 DIAGNOSIS — Z7982 Long term (current) use of aspirin: Secondary | ICD-10-CM | POA: Diagnosis not present

## 2017-05-19 DIAGNOSIS — I10 Essential (primary) hypertension: Secondary | ICD-10-CM

## 2017-05-19 DIAGNOSIS — I2510 Atherosclerotic heart disease of native coronary artery without angina pectoris: Secondary | ICD-10-CM | POA: Diagnosis not present

## 2017-05-19 DIAGNOSIS — I951 Orthostatic hypotension: Secondary | ICD-10-CM | POA: Insufficient documentation

## 2017-05-19 DIAGNOSIS — Z79899 Other long term (current) drug therapy: Secondary | ICD-10-CM | POA: Diagnosis not present

## 2017-05-19 DIAGNOSIS — Z886 Allergy status to analgesic agent status: Secondary | ICD-10-CM | POA: Insufficient documentation

## 2017-05-19 DIAGNOSIS — K219 Gastro-esophageal reflux disease without esophagitis: Secondary | ICD-10-CM | POA: Diagnosis not present

## 2017-05-19 DIAGNOSIS — I5042 Chronic combined systolic (congestive) and diastolic (congestive) heart failure: Secondary | ICD-10-CM

## 2017-05-19 DIAGNOSIS — E785 Hyperlipidemia, unspecified: Secondary | ICD-10-CM | POA: Diagnosis not present

## 2017-05-19 DIAGNOSIS — Z8249 Family history of ischemic heart disease and other diseases of the circulatory system: Secondary | ICD-10-CM | POA: Diagnosis not present

## 2017-05-19 DIAGNOSIS — Z800 Family history of malignant neoplasm of digestive organs: Secondary | ICD-10-CM | POA: Insufficient documentation

## 2017-05-19 DIAGNOSIS — I130 Hypertensive heart and chronic kidney disease with heart failure and stage 1 through stage 4 chronic kidney disease, or unspecified chronic kidney disease: Secondary | ICD-10-CM | POA: Insufficient documentation

## 2017-05-19 DIAGNOSIS — R0683 Snoring: Secondary | ICD-10-CM | POA: Insufficient documentation

## 2017-05-19 DIAGNOSIS — Z933 Colostomy status: Secondary | ICD-10-CM | POA: Diagnosis not present

## 2017-05-19 DIAGNOSIS — I252 Old myocardial infarction: Secondary | ICD-10-CM | POA: Insufficient documentation

## 2017-05-19 DIAGNOSIS — I493 Ventricular premature depolarization: Secondary | ICD-10-CM | POA: Diagnosis not present

## 2017-05-19 DIAGNOSIS — N183 Chronic kidney disease, stage 3 (moderate): Secondary | ICD-10-CM | POA: Insufficient documentation

## 2017-05-19 DIAGNOSIS — Z85038 Personal history of other malignant neoplasm of large intestine: Secondary | ICD-10-CM | POA: Insufficient documentation

## 2017-05-19 MED ORDER — LOSARTAN POTASSIUM 25 MG PO TABS: 25.0000 mg | ORAL_TABLET | Freq: Two times a day (BID) | ORAL | 6 refills | Status: DC

## 2017-05-19 NOTE — Progress Notes (Signed)
Advanced Heart Failure Clinic Note    Primary Care: [facility] Primary Cardiologist: Dr. Radford Pax    HPI: David Underwood is a 66 y.o. male with history of GERD, colon CA s/p colostomy, HTN, CKD stage III, nonobstructive CAD, PVCs, orthostatic hypotension and junctional rhythm. Also with history of newly diagnosed systolic CHF, echo in 03/9241 with EF 20-25% grade 2 DD, mild AI, mild-mod MR, mod TR, PASP 40.   Admitted 12/19/16-12/22/16 with DOE, nausea and vomiting. Troponin 0.26, EKG showed sinus bradycardia with diffuse T wave abnormalities c/w inferolateral ischemia. Left heart cath showed nonobstructive CAD. Noted to have frequent PVC's on tele. He was started on Entresto at discharge.   Admitted 12/23/16-12/27/16 with near syncope, nausea and SOB. He was orthostatic sitting BP 136/95, standing 91/56. His beta blocker was stopped because of bradycardia and some junctional rhythms noted on tele. Frequent PVC's continued on tele. His Entresto was stopped. Discharge weight was 208 pounds.   Was started on amiodarone. Recent monitor with PVC burden down to 5%  He presents today for regular follow up.  At last visit spiro increased. Says he is doing ok. Cutting grass and raking leaves. No DOE, orthopnea or PND. No edema. No CP. Checking BP at home and SBP 155-160. Failed sleeps study. Now using CPAP. Thinks it is helping him. No syncope or palpitations.    cMRI 02/09/17 LVEF 20%, Normal RV, No LGE.   Review of systems complete and found to be negative unless listed in HPI.    Past Medical History:  Diagnosis Date  . Back pain   . Chronic combined systolic and diastolic heart failure ([place]) 12/24/2016  . Colon cancer [facility])    s/p colostomy  . GERD (gastroesophageal reflux disease)    with associated esophagitis on PPI  . Hyperlipidemia   . Hypertension   . Junctional rhythm   . Myocardial infarction ([place])   . NICM (nonischemic cardiomyopathy) ([place])   . Nonobstructive  atherosclerosis of coronary artery    a. 12/21/16- Cath: non-obstructive CAD, EF <25%.  . Orthostatic hypotension   . PVC's (premature ventricular contractions)     Current Outpatient Prescriptions  Medication Sig Dispense Refill  . amiodarone (PACERONE) 200 MG tablet Take 1 tablet (200 mg total) by mouth daily. 30 tablet 11  . aspirin 81 MG EC tablet Take 1 tablet (81 mg total) by mouth daily. 30 tablet 11  . co-enzyme Q-10 30 MG capsule Take 30 mg by mouth daily.    Marland Kitchen losartan (COZAAR) 25 MG tablet Take 25 mg by mouth daily.    . nitroGLYCERIN (NITROSTAT) 0.4 MG SL tablet Place 1 tablet (0.4 mg total) under the tongue every 5 (five) minutes x 3 doses as needed for chest pain. 25 tablet 12  . pantoprazole (PROTONIX) 40 MG tablet Take 1 tablet (40 mg total) by mouth daily. 30 tablet 5  . rosuvastatin (CRESTOR) 20 MG tablet Take 20 mg by mouth daily.    Marland Kitchen spironolactone (ALDACTONE) 25 MG tablet Take 1 tablet (25 mg total) by mouth at bedtime. 30 tablet 6   No current facility-administered medications for this encounter.     Allergies  Allergen Reactions  . Aspirin Other (See Comments)    "stomach irritation", GI bleeding, also mentions colon CA      Social History   Social History  . Marital status: Married    Spouse name: N/A  . Number of children: N/A  . Years of education: N/A   Occupational History  .  Not on file.   Social History Main Topics  . Smoking status: Never Smoker  . Smokeless tobacco: Never Used  . Alcohol use No  . Drug use: No  . Sexual activity: No   Other Topics Concern  . Not on file   Social History Narrative  . No narrative on file      Family History  Problem Relation Age of Onset  . Colon cancer Mother   . Deep vein thrombosis Father   . Heart disease Brother   . Heart attack Brother     Vitals:   05/19/17 1244  BP: (!) 179/102  Pulse: 68  SpO2: 98%  Weight: 213 lb (96.6 kg)   Wt Readings from Last 3 Encounters:  05/19/17 213  lb (96.6 kg)  05/18/17 213 lb 12.8 oz (97 kg)  03/30/17 211 lb 9.6 oz (96 kg)    PHYSICAL EXAM: General:  Well appearing. No resp difficulty HEENT: normal Neck: supple. no obvious JVD (hard toe see with body habitus). Carotids 2+ bilat; no bruits. No lymphadenopathy or thryomegaly appreciated. Cor: PMI nondisplaced. Regular rate & rhythm. No rubs, gallops or murmurs. Lungs: clear Abdomen: obese soft, nontender, nondistended. No hepatosplenomegaly. No bruits or masses. Good bowel sounds. Extremities: no cyanosis, clubbing, rash, edema Neuro: alert & orientedx3, cranial nerves grossly intact. moves all 4 extremities w/o difficulty. Affect pleasant  ASSESSMENT & PLAN:  1. Acute on chronic systolic HF - due to NICM. EF  20-25%. Cath 5/18. Non-obstructive CAD - cMRI 7/18 with LVEF 20%, normal RV, and no LGE - Doing well. Stable NYHA class II symptoms - Volume status stable on exam.  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 Now on amiodarone and PVC burden down to 5% (will repeat echo to reassess EF).  - Has been seen by EP ([place]) who recommended continuing amio. Ablation not discussed. Will discuss long-term plan with them Would prefer long-term amio exposure if possible.   - Continue spironolactone 25 daily.  - Increase losartan to 25 bid. Did not tolerate entresto 24/26 with hypotension.  - Consider adding carvedilol at next visit..  - Reinforced fluid restriction to < 2 L daily, sodium restriction to less than 2000 mg daily, and the importance of daily weights.    2. CAD, nonobstructive - Continue ASA and statin.  - No s/s ischemia  3. Snoring - Failed sleep study. Now on CPAP 4. PVCs - Burden > 10% on amio via Holter monitor 02/18/17. Has appt with EP in 2 weeks - Holter 10/18 PVC burden 5%.  - See discussion above. Continue amio for now.   4. HTN - Poorly controlled. Increase losartan. Titrating gently with previous intolerance.    Glori Bickers, MD  12:48 PM

## 2017-05-19 NOTE — Patient Instructions (Signed)
INCREASE Losartan to 25 mg (1 tablet) twice daily.  Will schedule you for an echocardiogram at [facility]. Address: [address] ([place]), [place], [place] [address]  Phone: [phone] Their office will contact you by phone to schedule.  Follow up 3 months with Dr. Haroldine Laws. We will call you closer to this time, or you may call our office to schedule 1 month before you are due to be seen. Take all medication as prescribed the day of your appointment. Bring all medications with you to your appointment.  Do the following things EVERYDAY: 1) Weigh yourself in the morning before breakfast. Write it down and keep it in a log. 2) Take your medicines as prescribed 3) Eat low salt foods-Limit salt (sodium) to 2000 mg per day.  4) Stay as active as you can everyday 5) Limit all fluids for the day to less than 2 liters

## 2017-05-20 ENCOUNTER — Other Ambulatory Visit (HOSPITAL_COMMUNITY): Payer: Self-pay | Admitting: *Deleted

## 2017-05-20 MED ORDER — LOSARTAN POTASSIUM 25 MG PO TABS: 25.0000 mg | ORAL_TABLET | Freq: Two times a day (BID) | ORAL | 6 refills | Status: DC

## 2017-05-26 ENCOUNTER — Other Ambulatory Visit: Payer: Self-pay

## 2017-05-26 ENCOUNTER — Ambulatory Visit (HOSPITAL_COMMUNITY): Payer: Medicare HMO | Attending: Cardiovascular Disease

## 2017-05-26 DIAGNOSIS — I5042 Chronic combined systolic (congestive) and diastolic (congestive) heart failure: Secondary | ICD-10-CM | POA: Insufficient documentation

## 2017-06-10 ENCOUNTER — Telehealth (HOSPITAL_COMMUNITY): Payer: Self-pay | Admitting: *Deleted

## 2017-06-10 ENCOUNTER — Encounter (HOSPITAL_COMMUNITY): Payer: Self-pay

## 2017-06-10 ENCOUNTER — Ambulatory Visit (HOSPITAL_COMMUNITY)
Admission: RE | Admit: 2017-06-10 | Discharge: 2017-06-10 | Disposition: A | Discharge status: Home / Self Care | Payer: Medicare HMO | Source: Ambulatory Visit | Attending: Cardiology | Admitting: Cardiology

## 2017-06-10 VITALS — BP 188/108 | HR 74 | Wt 216.2 lb

## 2017-06-10 DIAGNOSIS — I5042 Chronic combined systolic (congestive) and diastolic (congestive) heart failure: Secondary | ICD-10-CM | POA: Insufficient documentation

## 2017-06-10 DIAGNOSIS — I493 Ventricular premature depolarization: Secondary | ICD-10-CM | POA: Insufficient documentation

## 2017-06-10 LAB — BASIC METABOLIC PANEL
Anion gap: 6 (ref 5–15)
BUN: 11 mg/dL (ref 6–20)
CHLORIDE: 108 mmol/L (ref 101–111)
CO2: 24 mmol/L (ref 22–32)
CREATININE: 1.47 mg/dL — AB (ref 0.61–1.24)
Calcium: 9.4 mg/dL (ref 8.9–10.3)
GFR calc Af Amer: 56 mL/min — ABNORMAL LOW (ref >60)
GFR calc non Af Amer: 48 mL/min — ABNORMAL LOW (ref >60)
Glucose, Bld: 115 mg/dL — ABNORMAL HIGH (ref 65–99)
Potassium: 3.8 mmol/L (ref 3.5–5.1)
SODIUM: 138 mmol/L (ref 135–145)

## 2017-06-10 LAB — BRAIN NATRIURETIC PEPTIDE: B NATRIURETIC PEPTIDE 5: 15.7 pg/mL (ref 0.0–100.0)

## 2017-06-10 MED ORDER — CARVEDILOL 6.25 MG PO TABS: 6.2500 mg | ORAL_TABLET | Freq: Two times a day (BID) | ORAL | 3 refills | Status: DC

## 2017-06-10 NOTE — Patient Instructions (Signed)
START taking Carvedilol 6.25 mg (1 Tablet) Twice daily.    Record BP readings over the weekend and call us Monday with your readings. [phone], Option 5

## 2017-06-10 NOTE — Telephone Encounter (Signed)
Advanced Heart Failure Triage Encounter  Patient Name: David Underwood  Date of Call: 06/10/17  Problem: High BP  Patient called to report BP yesterday was 205/105 and this morning while talking with him it was 228/105 but had not taken his medications yet.  He stated he was having chest discomfort yesterday but felt that it was heartburn.  No chest discomfort this morning.   Plan:  Patient advised to come in for BP check and EKG.  Per instructions after nurse visit he is to call back on Monday morning with BP readings. See nurse visit for detailed instructions.   Darron Doom, RN

## 2017-07-18 ENCOUNTER — Telehealth (HOSPITAL_COMMUNITY): Payer: Self-pay | Admitting: Vascular Surgery

## 2017-07-18 NOTE — Telephone Encounter (Signed)
Left pt message to make f/u appt w/ DB  

## 2017-09-05 ENCOUNTER — Encounter (HOSPITAL_COMMUNITY): Payer: Self-pay | Admitting: Internal Medicine

## 2017-09-05 ENCOUNTER — Ambulatory Visit (HOSPITAL_COMMUNITY)
Admission: RE | Admit: 2017-09-05 | Discharge: 2017-09-05 | Disposition: A | Discharge status: Home / Self Care | Payer: Medicare HMO | Source: Ambulatory Visit | Attending: Internal Medicine | Admitting: Internal Medicine

## 2017-09-05 ENCOUNTER — Other Ambulatory Visit: Payer: Self-pay

## 2017-09-05 VITALS — BP 170/110 | HR 69 | Wt 224.1 lb

## 2017-09-05 DIAGNOSIS — Z832 Family history of diseases of the blood and blood-forming organs and certain disorders involving the immune mechanism: Secondary | ICD-10-CM | POA: Insufficient documentation

## 2017-09-05 DIAGNOSIS — Z79899 Other long term (current) drug therapy: Secondary | ICD-10-CM | POA: Insufficient documentation

## 2017-09-05 DIAGNOSIS — N183 Chronic kidney disease, stage 3 (moderate): Secondary | ICD-10-CM | POA: Diagnosis not present

## 2017-09-05 DIAGNOSIS — G4733 Obstructive sleep apnea (adult) (pediatric): Secondary | ICD-10-CM

## 2017-09-05 DIAGNOSIS — Z85038 Personal history of other malignant neoplasm of large intestine: Secondary | ICD-10-CM | POA: Insufficient documentation

## 2017-09-05 DIAGNOSIS — Z886 Allergy status to analgesic agent status: Secondary | ICD-10-CM | POA: Insufficient documentation

## 2017-09-05 DIAGNOSIS — K219 Gastro-esophageal reflux disease without esophagitis: Secondary | ICD-10-CM | POA: Insufficient documentation

## 2017-09-05 DIAGNOSIS — Z933 Colostomy status: Secondary | ICD-10-CM | POA: Insufficient documentation

## 2017-09-05 DIAGNOSIS — I5042 Chronic combined systolic (congestive) and diastolic (congestive) heart failure: Secondary | ICD-10-CM | POA: Diagnosis present

## 2017-09-05 DIAGNOSIS — Z7982 Long term (current) use of aspirin: Secondary | ICD-10-CM | POA: Diagnosis not present

## 2017-09-05 DIAGNOSIS — Z8249 Family history of ischemic heart disease and other diseases of the circulatory system: Secondary | ICD-10-CM | POA: Insufficient documentation

## 2017-09-05 DIAGNOSIS — I130 Hypertensive heart and chronic kidney disease with heart failure and stage 1 through stage 4 chronic kidney disease, or unspecified chronic kidney disease: Secondary | ICD-10-CM | POA: Diagnosis not present

## 2017-09-05 DIAGNOSIS — Z9889 Other specified postprocedural states: Secondary | ICD-10-CM | POA: Diagnosis not present

## 2017-09-05 DIAGNOSIS — I429 Cardiomyopathy, unspecified: Secondary | ICD-10-CM | POA: Insufficient documentation

## 2017-09-05 DIAGNOSIS — I2510 Atherosclerotic heart disease of native coronary artery without angina pectoris: Secondary | ICD-10-CM | POA: Diagnosis not present

## 2017-09-05 DIAGNOSIS — I10 Essential (primary) hypertension: Secondary | ICD-10-CM | POA: Diagnosis not present

## 2017-09-05 DIAGNOSIS — I252 Old myocardial infarction: Secondary | ICD-10-CM | POA: Diagnosis not present

## 2017-09-05 DIAGNOSIS — Z800 Family history of malignant neoplasm of digestive organs: Secondary | ICD-10-CM | POA: Diagnosis not present

## 2017-09-05 DIAGNOSIS — I493 Ventricular premature depolarization: Secondary | ICD-10-CM | POA: Diagnosis not present

## 2017-09-05 DIAGNOSIS — I5022 Chronic systolic (congestive) heart failure: Secondary | ICD-10-CM

## 2017-09-05 DIAGNOSIS — E785 Hyperlipidemia, unspecified: Secondary | ICD-10-CM | POA: Insufficient documentation

## 2017-09-05 DIAGNOSIS — I951 Orthostatic hypotension: Secondary | ICD-10-CM | POA: Diagnosis not present

## 2017-09-05 LAB — BASIC METABOLIC PANEL
Anion gap: 9 (ref 5–15)
BUN: 10 mg/dL (ref 6–20)
CALCIUM: 9.3 mg/dL (ref 8.9–10.3)
CO2: 21 mmol/L — ABNORMAL LOW (ref 22–32)
CREATININE: 1.33 mg/dL — AB (ref 0.61–1.24)
Chloride: 110 mmol/L (ref 101–111)
GFR calc Af Amer: >60 mL/min (ref >60)
GFR calc non Af Amer: 55 mL/min — ABNORMAL LOW (ref >60)
GLUCOSE: 111 mg/dL — AB (ref 65–99)
Potassium: 4.1 mmol/L (ref 3.5–5.1)
Sodium: 140 mmol/L (ref 135–145)

## 2017-09-05 MED ORDER — LOSARTAN POTASSIUM 50 MG PO TABS: 50.0000 mg | ORAL_TABLET | Freq: Two times a day (BID) | ORAL | 3 refills | Status: DC

## 2017-09-05 NOTE — Patient Instructions (Signed)
Labs today (will call for abnormal results, otherwise no news is good news)  INCREASE Losartan to 50 mg (1 Tablet) Daily  Call triage nurse @ [phone], Option 5 regarding your Spironolactone.  See what dosage you're taking.   Echocardiogram and Follow up in 2 Months

## 2017-09-05 NOTE — Addendum Note (Signed)
Encounter addended by: Darron Doom, RN on: 09/05/2017 12:08 PM  Actions taken: Visit diagnoses modified, Diagnosis association updated, Pharmacy for encounter modified, Order list changed, Sign clinical note

## 2017-09-05 NOTE — Progress Notes (Signed)
Advanced Heart Failure Clinic Note    Primary Care: [facility] Primary Cardiologist: Dr. Radford Pax    HPI: Bee Hammerschmidt is a 67 y.o. male with history of GERD, colon CA s/p colostomy, HTN, CKD stage III, nonobstructive CAD, PVCs, orthostatic hypotension and junctional rhythm. Also with history of newly diagnosed systolic CHF, echo in 12/107 with EF 20-25% grade 2 DD, mild AI, mild-mod MR, mod TR, PASP 40.   Admitted 12/19/16-12/22/16 with DOE, nausea and vomiting. Troponin 0.26, EKG showed sinus bradycardia with diffuse T wave abnormalities c/w inferolateral ischemia. Left heart cath showed nonobstructive CAD. Noted to have frequent PVC's on tele. He was started on Entresto at discharge.   Admitted 12/23/16-12/27/16 with near syncope, nausea and SOB. He was orthostatic sitting BP 136/95, standing 91/56. His beta blocker was stopped because of bradycardia and some junctional rhythms noted on tele. Frequent PVC's continued on tele. His Entresto was stopped. Discharge weight was 208 pounds.   cMRI 02/09/17 LVEF 20%, Normal RV, No LGE.   Was started on amiodarone. Recent monitor with PVC burden down to 5% on 10/18. Echo 11/18 EF 30-35%   He presents today for regular follow up. Feels much better. Working on Scientist, physiological. No edema, orthopnea or PND. Has a tender spot on his chest but no angina. Recently having sinus symptoms. Wearing CPAP. SBP 130-140 at home     Review of systems complete and found to be negative unless listed in HPI.    Past Medical History:  Diagnosis Date  . Back pain   . Chronic combined systolic and diastolic heart failure ([place]) 12/24/2016  . Colon cancer [facility])    s/p colostomy  . GERD (gastroesophageal reflux disease)    with associated esophagitis on PPI  . Hyperlipidemia   . Hypertension   . Junctional rhythm   . Myocardial infarction ([place])   . NICM (nonischemic cardiomyopathy) ([place])   . Nonobstructive atherosclerosis of coronary artery      a. 12/21/16- Cath: non-obstructive CAD, EF <25%.  . Orthostatic hypotension   . PVC's (premature ventricular contractions)     Current Outpatient Medications  Medication Sig Dispense Refill  . amiodarone (PACERONE) 200 MG tablet Take 1 tablet (200 mg total) by mouth daily. 30 tablet 11  . aspirin 81 MG EC tablet Take 1 tablet (81 mg total) by mouth daily. 30 tablet 11  . carvedilol (COREG) 6.25 MG tablet Take 1 tablet (6.25 mg total) 2 (two) times daily with a meal by mouth. 180 tablet 3  . co-enzyme Q-10 30 MG capsule Take 30 mg by mouth daily.    Marland Kitchen losartan (COZAAR) 25 MG tablet Take 1 tablet (25 mg total) by mouth 2 (two) times daily. 60 tablet 6  . nitroGLYCERIN (NITROSTAT) 0.4 MG SL tablet Place 1 tablet (0.4 mg total) under the tongue every 5 (five) minutes x 3 doses as needed for chest pain. 25 tablet 12  . pantoprazole (PROTONIX) 40 MG tablet Take 1 tablet (40 mg total) by mouth daily. 30 tablet 5  . rosuvastatin (CRESTOR) 20 MG tablet Take 20 mg by mouth daily.    Marland Kitchen spironolactone (ALDACTONE) 25 MG tablet Take 1 tablet (25 mg total) by mouth daily. 30 tablet 3   No current facility-administered medications for this encounter.     Allergies  Allergen Reactions  . Aspirin Other (See Comments)    "stomach irritation", GI bleeding, also mentions colon CA      Social History   Socioeconomic History  .  Marital status: Married    Spouse name: Not on file  . Number of children: Not on file  . Years of education: Not on file  . Highest education level: Not on file  Social Needs  . Financial resource strain: Not on file  . Food insecurity - worry: Not on file  . Food insecurity - inability: Not on file  . Transportation needs - medical: Not on file  . Transportation needs - non-medical: Not on file  Occupational History  . Not on file  Tobacco Use  . Smoking status: Never Smoker  . Smokeless tobacco: Never Used  Substance and Sexual Activity  . Alcohol use: No  . Drug  use: No  . Sexual activity: No  Other Topics Concern  . Not on file  Social History Narrative  . Not on file      Family History  Problem Relation Age of Onset  . Colon cancer Mother   . Deep vein thrombosis Father   . Heart disease Brother   . Heart attack Brother     Vitals:   09/05/17 1123  BP: (!) 170/110  Pulse: 69  SpO2: 96%  Weight: 224 lb 1.9 oz (101.7 kg)   Wt Readings from Last 3 Encounters:  09/05/17 224 lb 1.9 oz (101.7 kg)  06/10/17 216 lb 3.2 oz (98.1 kg)  05/19/17 213 lb (96.6 kg)    PHYSICAL EXAM: General:  Well appearing. No resp difficulty HEENT: normal Neck: supple. no JVD. Carotids 2+ bilat; no bruits. No lymphadenopathy or thryomegaly appreciated. Cor: PMI nondisplaced. Regular rate & rhythm. No rubs, gallops or murmurs. Lungs: clear Abdomen: soft, nontender, nondistended. No hepatosplenomegaly. No bruits or masses. Good bowel sounds. Extremities: no cyanosis, clubbing, rash, edema Neuro: alert & orientedx3, cranial nerves grossly intact. moves all 4 extremities w/o difficulty. Affect pleasant  ASSESSMENT & PLAN:  1. Acute on chronic systolic HF - due to NICM. EF  20-25%. Cath 5/18. Non-obstructive CAD - cMRI 7/18 with LVEF 20%, normal RV, and no LGE - echo 11/18 EF 30-35%. No ICD yet as EF improving.  - Doing well. Stable NYHA II symptoms. Volume status stable.  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 Now on amiodarone and PVC burden down to 5% on monitor 10/18  - Has been seen by EP ([place]) who recommended continuing amio. Ablation not discussed. Will discuss long-term plan with them Would prefer long-term amio exposure if possible.   - Continue spironolactone 25 daily.  - Increase losartan to 50 bid. Did not tolerate entresto 24/26 with hypotension.  - Continue carvedilol 6.25 bid - Reinforced fluid restriction to < 2 L daily, sodium restriction to less than 2000 mg daily, and the importance of  daily weights.    2. CAD, nonobstructive - Continue ASA and statin.  - No s/s ischemia  3. Snoring - Failed sleep study. Now on CPAP  4. PVCs - Burden > 10% on amio via Holter monitor 02/18/17. Has appt with EP in 2 weeks - Holter 10/18 PVC burden 5%.  - See discussion above. Continue amio for now.   4. HTN - Improved but still elevated. Increase losartan to 50 bid.  Glori Bickers, MD  11:53 AM

## 2017-10-12 ENCOUNTER — Telehealth: Payer: Self-pay | Admitting: *Deleted

## 2017-10-12 NOTE — Telephone Encounter (Signed)
Reached ouit to the patient to make a 10 week follow up visit appointment. LMTCB

## 2017-10-12 NOTE — Telephone Encounter (Signed)
Per Pam at [facility] patient was set up in September of 2018. Pam states their patients are responsible for calling their doctors office and making their follow up visit after 31 days but before 91 days.   Reached ouit to the patient to make a 10 week follow up visit appointment. LMTCB

## 2017-10-18 NOTE — Telephone Encounter (Signed)
10 week ov for sleep. pt has had CPAP since sept 2018. overdue for compliance. Patient scheduled 10/26/17 at 11:20 with TT.

## 2017-10-26 ENCOUNTER — Ambulatory Visit (INDEPENDENT_AMBULATORY_CARE_PROVIDER_SITE_OTHER): Payer: Medicare HMO | Admitting: Cardiology

## 2017-10-26 ENCOUNTER — Encounter: Payer: Self-pay | Admitting: Cardiology

## 2017-10-26 VITALS — BP 118/72 | HR 60 | Ht 71.0 in | Wt 222.0 lb

## 2017-10-26 DIAGNOSIS — E7800 Pure hypercholesterolemia, unspecified: Secondary | ICD-10-CM | POA: Diagnosis not present

## 2017-10-26 DIAGNOSIS — I10 Essential (primary) hypertension: Secondary | ICD-10-CM

## 2017-10-26 DIAGNOSIS — Z9989 Dependence on other enabling machines and devices: Secondary | ICD-10-CM

## 2017-10-26 DIAGNOSIS — I2510 Atherosclerotic heart disease of native coronary artery without angina pectoris: Secondary | ICD-10-CM | POA: Diagnosis not present

## 2017-10-26 DIAGNOSIS — I428 Other cardiomyopathies: Secondary | ICD-10-CM

## 2017-10-26 DIAGNOSIS — G4733 Obstructive sleep apnea (adult) (pediatric): Secondary | ICD-10-CM | POA: Diagnosis not present

## 2017-10-26 DIAGNOSIS — I493 Ventricular premature depolarization: Secondary | ICD-10-CM | POA: Diagnosis not present

## 2017-10-26 DIAGNOSIS — I5042 Chronic combined systolic (congestive) and diastolic (congestive) heart failure: Secondary | ICD-10-CM | POA: Diagnosis not present

## 2017-10-26 HISTORY — DX: Obstructive sleep apnea (adult) (pediatric): G47.33

## 2017-10-26 NOTE — Progress Notes (Addendum)
Cardiology Office Note:    Date:  10/26/2017   ID:  David Underwood, DOB 1950/08/10, MRN [id number]  PCP:  [facility]  Cardiologist:  No primary care provider on file.    Referring MD: [place]   Chief Complaint  Patient presents with  . Cardiomyopathy  . Sleep Apnea    History of Present Illness:    David Underwood is a 67 y.o. male with a hx of nonischemic dilated cardiomyopathy with 2D echocardiogram May 2018 showing EF 20-25%.  Left heart cath showed nonobstructive coronary disease.  A history of frequent PVCs with 24-hour Holter monitor in June 2018 showing PVC burden 15%.  Cardiomyopathy was felt secondary to tachycardia mediated secondary to high PVC burden.  He is followed in advanced heart failure clinic.  His beta-blocker was stopped due to problems with bradycardia and junctional rhythms and Entresto was stopped due to orthostatic hypotension.  Repeat 24-hour Holter monitor in July 2018 on amiodarone showed improvement in PVCs with PVC burden of 10%.  Cardiac MRI on 02/09/2017 showed EF 20% with normal RV and no LGE.  Repeat 2D echocardiogram 05/2017 showed EF improved to 30-35%.  He was seen by Dr. Curt Bears in EP clinic.  Repeat heart monitor showed PVC burden decreased to 5% and therefore no PVC ablation recommended at that time.  Since EF had been improving it was felt to continue to watch patient and not place an ICD.  He recently saw Dr. Haroldine Laws in February 2019.  He was doing well on losartan which was restarted after he developed hypotension on Entresto.  Also started on low-dose carvedilol at 6.25 mg twice daily which he has been tolerating.  He was also diagnosed with moderate obstructive sleep apnea with an AHI of 28.5/h and no significant central sleep apnea.  Oxygen saturations were 93% at the nadir.  He is now on CPAP at 11 cm water pressure.He is doing well with his CPAP device and thinks that he has gotten used to it.  He tolerates the  mask and feels the pressure is adequate.  Since going on CPAP he feels rested in the am and has no significant daytime sleepiness.  He denies any significant mouth or nasal dryness or nasal congestion.  He does not think that he snores.  He denies any chest pain or pressure, SOB, DOE, PND, orthopnea, LE edema, dizziness, palpitations or syncope. He is compliant with his meds and is tolerating meds with no SE.    Past Medical History:  Diagnosis Date  . Back pain   . Chronic combined systolic and diastolic heart failure ([place]) 12/24/2016  . Colon cancer [facility])    s/p colostomy  . GERD (gastroesophageal reflux disease)    with associated esophagitis on PPI  . Hyperlipidemia   . Hypertension   . Junctional rhythm   . Myocardial infarction ([place])   . NICM (nonischemic cardiomyopathy) ([place])   . Nonobstructive atherosclerosis of coronary artery    a. 12/21/16- Cath: non-obstructive CAD, EF <25%.  . Orthostatic hypotension   . OSA on CPAP 10/26/2017   Moderate with AHI 28/hr now on CPAP at 11cm H2O.    . PVC's (premature ventricular contractions)     Past Surgical History:  Procedure Laterality Date  . COLON SURGERY    . LEFT HEART CATH AND CORONARY ANGIOGRAPHY N/A 12/21/2016   Procedure: Left Heart Cath and Coronary Angiography;  Surgeon: Martinique, Peter M, MD;  Location: [place] CV LAB;  Service: Cardiovascular;  Laterality: N/A;    Current Medications: Current Meds  Medication Sig  . amiodarone (PACERONE) 200 MG tablet Take 1 tablet (200 mg total) by mouth daily.  . carvedilol (COREG) 6.25 MG tablet Take 1 tablet (6.25 mg total) 2 (two) times daily with a meal by mouth.  . co-enzyme Q-10 30 MG capsule Take 30 mg by mouth daily.  Marland Kitchen losartan (COZAAR) 25 MG tablet Take 25 mg by mouth 2 (two) times daily.  . nitroGLYCERIN (NITROSTAT) 0.4 MG SL tablet Place 1 tablet (0.4 mg total) under the tongue every 5 (five) minutes x 3 doses as needed for chest pain.  . rosuvastatin (CRESTOR) 20 MG tablet  Take 20 mg by mouth daily.  Marland Kitchen spironolactone (ALDACTONE) 25 MG tablet Take 1 tablet (25 mg total) by mouth daily.     Allergies:   Aspirin   Social History   Socioeconomic History  . Marital status: Married    Spouse name: Not on file  . Number of children: Not on file  . Years of education: Not on file  . Highest education level: Not on file  Occupational History  . Not on file  Social Needs  . Financial resource strain: Not on file  . Food insecurity:    Worry: Not on file    Inability: Not on file  . Transportation needs:    Medical: Not on file    Non-medical: Not on file  Tobacco Use  . Smoking status: Never Smoker  . Smokeless tobacco: Never Used  Substance and Sexual Activity  . Alcohol use: No  . Drug use: No  . Sexual activity: Never  Lifestyle  . Physical activity:    Days per week: Not on file    Minutes per session: Not on file  . Stress: Not on file  Relationships  . Social connections:    Talks on phone: Not on file    Gets together: Not on file    Attends religious service: Not on file    Active member of club or organization: Not on file    Attends meetings of clubs or organizations: Not on file    Relationship status: Not on file  Other Topics Concern  . Not on file  Social History Narrative  . Not on file     Family History: The patient's family history includes Colon cancer in his mother; Deep vein thrombosis in his father; Heart attack in his brother; Heart disease in his brother.  ROS:   Please see the history of present illness.    ROS  All other systems reviewed and negative.   EKGs/Labs/Other Studies Reviewed:    The following studies were reviewed today: Cardiac MRI, 2D echo  EKG:  EKG is not ordered today.  =  Recent Labs: 12/20/2016: TSH 1.197 12/22/2016: Hemoglobin 16.6; Platelets 190 12/25/2016: Magnesium 2.1 02/08/2017: ALT 18 06/10/2017: B Natriuretic Peptide 15.7 09/05/2017: BUN 10; Creatinine, Ser 1.33; Potassium 4.1;  Sodium 140   Recent Lipid Panel    Component Value Date/Time   CHOL 119 02/08/2017 0902   TRIG 61 02/08/2017 0902   HDL 54 02/08/2017 0902   CHOLHDL 2.2 02/08/2017 0902   CHOLHDL 3.5 12/20/2016 0454   VLDL 11 12/20/2016 0454   LDLCALC 53 02/08/2017 0902    Physical Exam:    VS:  BP 118/72   Pulse 60   Ht 5\' 11"  (1.803 m)   Wt 222 lb (100.7 kg)   BMI 30.96 kg/m  Wt Readings from Last 3 Encounters:  10/26/17 222 lb (100.7 kg)  09/05/17 224 lb 1.9 oz (101.7 kg)  06/10/17 216 lb 3.2 oz (98.1 kg)     GEN:  Well nourished, well developed in no acute distress HEENT: Normal NECK: No JVD; No carotid bruits LYMPHATICS: No lymphadenopathy CARDIAC: RRR, no murmurs, rubs, gallops RESPIRATORY:  Clear to auscultation without rales, wheezing or rhonchi  ABDOMEN: Soft, non-tender, non-distended MUSCULOSKELETAL:  No edema; No deformity  SKIN: Warm and dry NEUROLOGIC:  Alert and oriented x 3 PSYCHIATRIC:  Normal affect   ASSESSMENT:    1. Chronic combined systolic and diastolic heart failure ([place])   2. NICM (nonischemic cardiomyopathy) ([place])   3. PVC's (premature ventricular contractions)   4. Benign essential HTN   5. Coronary artery disease, non-occlusive with cath 12/21/16   6. Pure hypercholesterolemia   7. OSA on CPAP    PLAN:    In order of problems listed above:  1.  Chronic combined systolic/diastolic CHF -he appears euvolemic on exam today.  His weight is stable.  He is [place] heart association class II heart failure.  He will continue on carvedilol 6.25 mg twice daily, losartan 25 mg twice daily Aldactone 25 mg daily.  That was stable at 1.33 on 09/05/2017.  2.  NIDCM -EF initially 20-25% by 2D echo/2018, cardiac MRI 01/2017 EF 20% with normal RV and no LGE, repeat 2D echocardiogram in November 2018 showed improvement in EF to 30-35%.  ICD has not been placed since EF continues to improve on medical therapy.  He has a 2D echocardiogram ordered later this month to see  if EF has improved further.  3.  PVC's -PVC load is now down to 5% on p.o. Amiodarone.  Agree with Dr. Haroldine Laws, given patient's young age would question utility of long-term amiodarone and wonder if patient would do better with stopping amiodarone and having PVC ablation done.  I will send a note to Dr. Curt Bears in regards to this.  For now we will continue Pacerone 200 mg daily and carvedilol 6.25 mg twice daily.  4.  HTN -pressure is well controlled on exam today.  He will continue on carvedilol 6.25 mg twice daily, losartan 25 mg twice daily and Aldactone 25 mg daily.  5.  Nonobstructive CAD by cath 11/2016 with 30% mid LAD, 15% proximal OM 2, 30% mid RCA and 50% PDA.  He denies any anginal symptoms.  He will continue aspirin 81 mg daily, carvedilol and statin.  7.  Hyperlipidemia with LDL goal less than 70.  He will continue on Crestor 20 mg daily.  I will repeat an FLP and ALT today.  8.  OSA - the patient is tolerating PAP therapy well without any problems. The PAP download was reviewed today and showed an AHI of 1.8/hr on 11 cm H2O with 37% compliance in using more than 4 hours nightly.  The patient has been using and benefiting from PAP use and will continue to benefit from therapy.  I have encouraged him to be more compliant with his CPAP device.  He knows that if he is not compliant that insurance will take his device away.   Medication Adjustments/Labs and Tests Ordered: Current medicines are reviewed at length with the patient today.  Concerns regarding medicines are outlined above.  No orders of the defined types were placed in this encounter.  No orders of the defined types were placed in this encounter.   Signed, Fransico Him, MD  10/26/2017 11:58 AM    [facility]

## 2017-10-26 NOTE — Patient Instructions (Signed)
Medication Instructions:  Your physician recommends that you continue on your current medications as directed. Please refer to the Current Medication list given to you today.  If you need a refill on your cardiac medications, please contact your pharmacy first.  Labwork: None ordered   Testing/Procedures: None ordered   Follow-Up: Your physician wants you to follow-up in: 1 year with Dr. Turner. You will receive a reminder letter in the mail two months in advance. If you don't receive a letter, please call our office to schedule the follow-up appointment.  Any Other Special Instructions Will Be Listed Below (If Applicable).   Thank you for choosing [facility]    Rena Keaton Stirewalt, RN  [phone]  If you need a refill on your cardiac medications before your next appointment, please call your pharmacy.   

## 2017-10-28 NOTE — Telephone Encounter (Signed)
Called patient to make his 10 week compliance appointment left message with his son daniel to cal me back.

## 2017-11-03 ENCOUNTER — Ambulatory Visit (HOSPITAL_COMMUNITY): Admission: RE | Admit: 2017-11-03 | Payer: Medicare HMO | Source: Ambulatory Visit

## 2017-11-03 ENCOUNTER — Encounter (HOSPITAL_COMMUNITY): Payer: Medicare HMO | Admitting: Internal Medicine

## 2017-11-15 ENCOUNTER — Ambulatory Visit: Payer: Medicare HMO | Admitting: Cardiology

## 2017-11-15 ENCOUNTER — Encounter: Payer: Self-pay | Admitting: Cardiology

## 2017-11-15 VITALS — BP 144/100 | HR 55 | Ht 71.0 in | Wt 222.6 lb

## 2017-11-15 DIAGNOSIS — I10 Essential (primary) hypertension: Secondary | ICD-10-CM

## 2017-11-15 DIAGNOSIS — Z79899 Other long term (current) drug therapy: Secondary | ICD-10-CM

## 2017-11-15 DIAGNOSIS — G4733 Obstructive sleep apnea (adult) (pediatric): Secondary | ICD-10-CM | POA: Diagnosis not present

## 2017-11-15 DIAGNOSIS — I428 Other cardiomyopathies: Secondary | ICD-10-CM | POA: Diagnosis not present

## 2017-11-15 DIAGNOSIS — I2510 Atherosclerotic heart disease of native coronary artery without angina pectoris: Secondary | ICD-10-CM

## 2017-11-15 DIAGNOSIS — E785 Hyperlipidemia, unspecified: Secondary | ICD-10-CM | POA: Diagnosis not present

## 2017-11-15 DIAGNOSIS — I493 Ventricular premature depolarization: Secondary | ICD-10-CM

## 2017-11-15 LAB — HEPATIC FUNCTION PANEL
ALT: 21 IU/L (ref 0–44)
AST: 23 IU/L (ref 0–40)
Albumin: 4.5 g/dL (ref 3.6–4.8)
Alkaline Phosphatase: 50 IU/L (ref 39–117)
Bilirubin Total: 0.7 mg/dL (ref 0.0–1.2)
Bilirubin, Direct: 0.23 mg/dL (ref 0.00–0.40)
Total Protein: 7.3 g/dL (ref 6.0–8.5)

## 2017-11-15 LAB — TSH: TSH: 1.65 u[IU]/mL (ref 0.450–4.500)

## 2017-11-15 NOTE — Progress Notes (Signed)
Electrophysiology Office Note   Date:  11/15/2017   ID:  David Underwood, David Underwood [birth year], MRN [id number]  PCP:  [facility]  Cardiologist:  Radford Pax Primary Electrophysiologist:  David Underwood David Leeds, MD    Chief Complaint  Patient presents with  . Follow-up    PVC's     History of Present Illness: David Underwood is a 67 y.o. male who is being seen today for the evaluation of CHF, PVCs at the request of Center, [facility]. Presenting today for electrophysiology evaluation. He has a history of GERD, colon cancer status post colostomy, hypertension, CKG stage III, recently diagnosed nonischemic cardiomyopathy, nonobstructive coronary disease, PVCs, orthostatic hypotension, and junctional rhythm. Echo 11/2016 showed an EF of 20-25%.   He was initially on both beta-blockers and Entresto but these were stopped due to bradycardia and orthostatic hypotension.  Repeat cardiac monitor after initiation of amiodarone showed an improved  PVC burden at 5%.  An ICD was not implanted as his ejection fraction had improved.  He was started on losartan and low-dose carvedilol which he has been tolerating.  He has since been diagnosed with sleep apnea.  He wears CPAP.  Today, denies symptoms of palpitations, chest pain, shortness of breath, orthopnea, PND, lower extremity edema, claudication, dizziness, presyncope, syncope, bleeding, or neurologic sequela. The patient is tolerating medications without difficulties.  Is overall feeling well.  He has had no further issues with shortness of breath or fatigue.  He does not note further palpitations.   Past Medical History:  Diagnosis Date  . Back pain   . Chronic combined systolic and diastolic heart failure ([place]) 12/24/2016  . Colon cancer [facility])    s/p colostomy  . GERD (gastroesophageal reflux disease)    with associated esophagitis on PPI  . Hyperlipidemia   . Hypertension   . Junctional rhythm   . Myocardial infarction ([place])   .  NICM (nonischemic cardiomyopathy) ([place])   . Nonobstructive atherosclerosis of coronary artery    a. 12/21/16- Cath: non-obstructive CAD, EF <25%.  . Orthostatic hypotension   . OSA on CPAP 10/26/2017   Moderate with AHI 28/hr now on CPAP at 11cm H2O.    . PVC's (premature ventricular contractions)    Past Surgical History:  Procedure Laterality Date  . COLON SURGERY    . LEFT HEART CATH AND CORONARY ANGIOGRAPHY N/A 12/21/2016   Procedure: Left Heart Cath and Coronary Angiography;  Surgeon: Martinique, Peter M, MD;  Location: [place] CV LAB;  Service: Cardiovascular;  Laterality: N/A;     Current Outpatient Medications  Medication Sig Dispense Refill  . amiodarone (PACERONE) 200 MG tablet Take 1 tablet (200 mg total) by mouth daily. 30 tablet 11  . aspirin 81 MG EC tablet Take 1 tablet (81 mg total) by mouth daily. 30 tablet 11  . co-enzyme Q-10 30 MG capsule Take 30 mg by mouth daily.    Marland Kitchen losartan (COZAAR) 25 MG tablet Take 25 mg by mouth 2 (two) times daily.    . nitroGLYCERIN (NITROSTAT) 0.4 MG SL tablet Place 1 tablet (0.4 mg total) under the tongue every 5 (five) minutes x 3 doses as needed for chest pain. 25 tablet 12  . rosuvastatin (CRESTOR) 20 MG tablet Take 20 mg by mouth daily.    Marland Kitchen spironolactone (ALDACTONE) 25 MG tablet Take 1 tablet (25 mg total) by mouth daily. 30 tablet 3  . carvedilol (COREG) 6.25 MG tablet Take 1 tablet (6.25 mg total) 2 (two) times daily with a  meal by mouth. 180 tablet 3   No current facility-administered medications for this visit.     Allergies:   Aspirin   Social History:  The patient  reports that he has never smoked. He has never used smokeless tobacco. He reports that he does not drink alcohol or use drugs.   Family History:  The patient's family history includes Colon cancer in his mother; Deep vein thrombosis in his father; Heart attack in his brother; Heart disease in his brother.    ROS:  Please see the history of present illness.    Otherwise, review of systems is positive for none.   All other systems are reviewed and negative.   PHYSICAL EXAM: VS:  BP (!) 144/100   Pulse (!) 55   Ht 5\' 11"  (1.803 m)   Wt 222 lb 9.6 oz (101 kg)   BMI 31.05 kg/m  , BMI Body mass index is 31.05 kg/m. GEN: Well nourished, well developed, in no acute distress  HEENT: normal  Neck: no JVD, carotid bruits, or masses Cardiac: RRR; no murmurs, rubs, or gallops,no edema  Respiratory:  clear to auscultation bilaterally, normal work of breathing GI: soft, nontender, nondistended, + BS MS: no deformity or atrophy  Skin: warm and dry Neuro:  Strength and sensation are intact Psych: euthymic mood, full affect  EKG:  EKG is ordered today. Personal review of the ekg ordered shows SR, rate 55, nonspecific T wave abnormality  Recent Labs: 12/20/2016: TSH 1.197 12/22/2016: Hemoglobin 16.6; Platelets 190 12/25/2016: Magnesium 2.1 02/08/2017: ALT 18 06/10/2017: B Natriuretic Peptide 15.7 09/05/2017: BUN 10; Creatinine, Ser 1.33; Potassium 4.1; Sodium 140    Lipid Panel     Component Value Date/Time   CHOL 119 02/08/2017 0902   TRIG 61 02/08/2017 0902   HDL 54 02/08/2017 0902   CHOLHDL 2.2 02/08/2017 0902   CHOLHDL 3.5 12/20/2016 0454   VLDL 11 12/20/2016 0454   LDLCALC 53 02/08/2017 0902     Wt Readings from Last 3 Encounters:  11/15/17 222 lb 9.6 oz (101 kg)  10/26/17 222 lb (100.7 kg)  09/05/17 224 lb 1.9 oz (101.7 kg)      Other studies Reviewed: Additional studies/ records that were reviewed today include: TTE 12/20/16  Review of the above records today demonstrates:  - Left ventricle: The cavity size was mildly dilated. Wall   thickness was normal. Systolic function was severely reduced. The   estimated ejection fraction was in the range of 20% to 25%.   Diffuse hypokinesis. Features are consistent with a pseudonormal   left ventricular filling pattern, with concomitant abnormal   relaxation and increased filling pressure  (grade 2 diastolic   dysfunction). - Aortic valve: There was mild regurgitation. - Mitral valve: There was mild to moderate regurgitation. - Right atrium: The atrium was mildly dilated. - Tricuspid valve: There was moderate regurgitation. - Pulmonary arteries: Systolic pressure was moderately increased.   PA peak pressure: 40 mm Hg (S).  Cath 12/21/16  Mid RCA lesion, 30 %stenosed.  RPDA-2 lesion, 50 %stenosed.  RPDA-1 lesion, 50 %stenosed.  Prox LAD to Mid LAD lesion, 30 %stenosed.  Ost 2nd Mrg to 2nd Mrg lesion, 15 %stenosed.  There is severe left ventricular systolic dysfunction.  LV end diastolic pressure is normal.  The left ventricular ejection fraction is less than 25% by visual estimate.   1. Nonobstructive CAD 2. Severe LV dysfunction 3. Normal LVEDP  Holter 02/22/17 - personally reviewed  Normal sinus rhythm and sinus tachycardia.  The average heart rate was 72bpm and the heart rate ranged from 45-119bpm.  Frequent PVCs, ventricular couplets, ventricular bigeminy, multifocal ventricular salvos.  PVC load was 10%  CMRI 02/09/17 1. Mildly dilated LV with mild concentric LVH. Diffuse hypokinesis with EF 20%.  2.  Normal RV size and systolic function.  3. No myocardial LGE, so no definitive evidence for prior MI, infiltrative disease, or myocarditis.  Holter 05/16/17 - personally reviewed Minimum HR: 40 BPM at 4:17:37 AM(2) Maximum HR: 128 BPM at 4:21:48 PM Average HR: 68 BPM 5% PVCs <1% APCs Sinus rhythm Zero atrial fibrillation  ASSESSMENT AND PLAN:  1.  Nonischemic cardiomyopathy: Currently on Aldactone and losartan.  Was told that his losartan has been recalled.  We have touched base with his pharmacy who says that they now have it in stock.  He cannot tolerate beta-blockers due to orthostatic hypotension.    2. Obstructive sleep apnea: CPAP compliance encouraged  3. Nonobstructive coronary artery disease: No current chest pain.  4. PVCs:  Currently on amiodarone.  Repeat monitor showed 5% PVC burden.  He is feeling well without major complaint.  He is quite young to be on amiodarone and thus we did discuss further ablation.  Risks and benefits were discussed.  Risks include bleeding, tamponade, heart block, stroke.  The patient would like to consider his options further.  We David Underwood touch base later this week.  5.  Hypertension: Pressure is elevated today though he is not taking his losartan as he was told it was recalled.  Pharmacy has been called and they have losartan in stock.  No changes.  6.  Hyperlipidemia: Continue Crestor  Current medicines are reviewed at length with the patient today.   The patient does not have concerns regarding his medicines.  The following changes were made today: None  Labs/ tests ordered today include:  Orders Placed This Encounter  Procedures  . TSH  . Hepatic function panel  . EKG 12-Lead     Disposition:   FU with David Underwood 3  month  Signed, David Underwood David Leeds, MD  11/15/2017 10:41 AM     [facility] [address] [place] [address] [phone] (office) [phone] (fax)

## 2017-11-15 NOTE — Patient Instructions (Addendum)
Medication Instructions:  Your physician recommends that you continue on your current medications as directed. Please refer to the Current Medication list given to you today.  Labwork: Amiodarone surveillance lab work today: TSH & LFT  Testing/Procedures: None ordered  Follow-Up: To be determined.  * If you need a refill on your cardiac medications before your next appointment, please call your pharmacy.   *Please note that any paperwork needing to be filled out by the provider will need to be addressed at the front desk prior to seeing the provider. Please note that any FMLA, disability or other documents regarding health condition is subject to a $25.00 charge that must be received prior to completion of paperwork in the form of a money order or check.  Thank you for choosing [facility]!!   Trinidad Curet, RN [phone]  Any Other Special Instructions Will Be Listed Below (If Applicable). Please call the office if/when you decide to proceed with ablation.  Cardiac Ablation Cardiac ablation is a procedure to disable (ablate) a small amount of heart tissue in very specific places. The heart has many electrical connections. Sometimes these connections are abnormal and can cause the heart to beat very fast or irregularly. Ablating some of the problem areas can improve the heart rhythm or return it to normal. Ablation may be done for people who:  Have Wolff-Parkinson-White syndrome.  Have fast heart rhythms (tachycardia).  Have taken medicines for an abnormal heart rhythm (arrhythmia) that were not effective or caused side effects.  Have a high-risk heartbeat that may be life-threatening.  During the procedure, a small incision is made in the neck or the groin, and a long, thin, flexible tube (catheter) is inserted into the incision and moved to the heart. Small devices (electrodes) on the tip of the catheter will send out electrical currents. A type of X-ray (fluoroscopy) will  be used to help guide the catheter and to provide images of the heart. Tell a health care provider about:  Any allergies you have.  All medicines you are taking, including vitamins, herbs, eye drops, creams, and over-the-counter medicines.  Any problems you or family members have had with anesthetic medicines.  Any blood disorders you have.  Any surgeries you have had.  Any medical conditions you have, such as kidney failure.  Whether you are pregnant or may be pregnant. What are the risks? Generally, this is a safe procedure. However, problems may occur, including:  Infection.  Bruising and bleeding at the catheter insertion site.  Bleeding into the chest, especially into the sac that surrounds the heart. This is a serious complication.  Stroke or blood clots.  Damage to other structures or organs.  Allergic reaction to medicines or dyes.  Need for a permanent pacemaker if the normal electrical system is damaged. A pacemaker is a small computer that sends electrical signals to the heart and helps your heart beat normally.  The procedure not being fully effective. This may not be recognized until months later. Repeat ablation procedures are sometimes required.  What happens before the procedure?  Follow instructions from your health care provider about eating or drinking restrictions.  Ask your health care provider about: ? Changing or stopping your regular medicines. This is especially important if you are taking diabetes medicines or blood thinners. ? Taking medicines such as aspirin and ibuprofen. These medicines can thin your blood. Do not take these medicines before your procedure if your health care provider instructs you not to.  Plan to  have someone take you home from the hospital or clinic.  If you will be going home right after the procedure, plan to have someone with you for 24 hours. What happens during the procedure?  To lower your risk of  infection: ? Your health care team will wash or sanitize their hands. ? Your skin will be washed with soap. ? Hair may be removed from the incision area.  An IV tube will be inserted into one of your veins.  You will be given a medicine to help you relax (sedative).  The skin on your neck or groin will be numbed.  An incision will be made in your neck or your groin.  A needle will be inserted through the incision and into a large vein in your neck or groin.  A catheter will be inserted into the needle and moved to your heart.  Dye may be injected through the catheter to help your surgeon see the area of the heart that needs treatment.  Electrical currents will be sent from the catheter to ablate heart tissue in desired areas. There are three types of energy that may be used to ablate heart tissue: ? Heat (radiofrequency energy). ? Laser energy. ? Extreme cold (cryoablation).  When the necessary tissue has been ablated, the catheter will be removed.  Pressure will be held on the catheter insertion area to prevent excessive bleeding.  A bandage (dressing) will be placed over the catheter insertion area. The procedure may vary among health care providers and hospitals. What happens after the procedure?  Your blood pressure, heart rate, breathing rate, and blood oxygen level will be monitored until the medicines you were given have worn off.  Your catheter insertion area will be monitored for bleeding. You will need to lie still for a few hours to ensure that you do not bleed from the catheter insertion area.  Do not drive for 24 hours or as long as directed by your health care provider. Summary  Cardiac ablation is a procedure to disable (ablate) a small amount of heart tissue in very specific places. Ablating some of the problem areas can improve the heart rhythm or return it to normal.  During the procedure, electrical currents will be sent from the catheter to ablate heart  tissue in desired areas. This information is not intended to replace advice given to you by your health care provider. Make sure you discuss any questions you have with your health care provider. Document Released: 11/28/2008 Document Revised: 05/31/2016 Document Reviewed: 05/31/2016 Elsevier Interactive Patient Education  [employer].

## 2017-11-21 ENCOUNTER — Other Ambulatory Visit (HOSPITAL_COMMUNITY): Payer: Self-pay | Admitting: *Deleted

## 2017-11-21 ENCOUNTER — Telehealth: Payer: Self-pay | Admitting: Cardiology

## 2017-11-21 MED ORDER — LOSARTAN POTASSIUM 25 MG PO TABS: 25.0000 mg | ORAL_TABLET | Freq: Two times a day (BID) | ORAL | 3 refills | Status: DC

## 2017-11-21 NOTE — Telephone Encounter (Signed)
New message    Patient calling to discuss and schedule a procedure that was discussed in last office visit.

## 2017-11-22 NOTE — Telephone Encounter (Signed)
Left message w/ wife informing patient I would try and reach him tomorrow/Thursday.

## 2017-11-24 NOTE — Telephone Encounter (Signed)
Pt understands I will call him Monday to arrange procedure date.  He is agreeable to plan.

## 2017-11-28 NOTE — Telephone Encounter (Signed)
Pt not at home. Wife asks me to call after 3:30 pm tomorrow and should have a better chance of reaching him. Pt will let him know

## 2017-12-05 NOTE — Telephone Encounter (Signed)
Pt's wife is currently in the hospital and he would prefer till she gets out before scheudling procedure.  States she should be out next week. We agreed to follow up end of next week/the following week.

## 2017-12-16 ENCOUNTER — Encounter (HOSPITAL_COMMUNITY): Payer: Self-pay | Admitting: Internal Medicine

## 2017-12-16 ENCOUNTER — Ambulatory Visit (HOSPITAL_BASED_OUTPATIENT_CLINIC_OR_DEPARTMENT_OTHER)
Admission: RE | Admit: 2017-12-16 | Discharge: 2017-12-16 | Disposition: A | Discharge status: Home / Self Care | Payer: Medicare HMO | Source: Ambulatory Visit | Attending: Internal Medicine | Admitting: Internal Medicine

## 2017-12-16 ENCOUNTER — Ambulatory Visit (HOSPITAL_COMMUNITY)
Admission: RE | Admit: 2017-12-16 | Discharge: 2017-12-16 | Disposition: A | Discharge status: Home / Self Care | Payer: Medicare HMO | Source: Ambulatory Visit | Attending: Internal Medicine | Admitting: Internal Medicine

## 2017-12-16 VITALS — BP 198/104 | HR 59 | Wt 221.4 lb

## 2017-12-16 DIAGNOSIS — Z886 Allergy status to analgesic agent status: Secondary | ICD-10-CM | POA: Diagnosis not present

## 2017-12-16 DIAGNOSIS — I951 Orthostatic hypotension: Secondary | ICD-10-CM | POA: Insufficient documentation

## 2017-12-16 DIAGNOSIS — I5042 Chronic combined systolic (congestive) and diastolic (congestive) heart failure: Secondary | ICD-10-CM | POA: Insufficient documentation

## 2017-12-16 DIAGNOSIS — R0683 Snoring: Secondary | ICD-10-CM | POA: Diagnosis not present

## 2017-12-16 DIAGNOSIS — Z800 Family history of malignant neoplasm of digestive organs: Secondary | ICD-10-CM | POA: Insufficient documentation

## 2017-12-16 DIAGNOSIS — E785 Hyperlipidemia, unspecified: Secondary | ICD-10-CM | POA: Insufficient documentation

## 2017-12-16 DIAGNOSIS — I340 Nonrheumatic mitral (valve) insufficiency: Secondary | ICD-10-CM | POA: Diagnosis not present

## 2017-12-16 DIAGNOSIS — Z85038 Personal history of other malignant neoplasm of large intestine: Secondary | ICD-10-CM | POA: Insufficient documentation

## 2017-12-16 DIAGNOSIS — I428 Other cardiomyopathies: Secondary | ICD-10-CM | POA: Insufficient documentation

## 2017-12-16 DIAGNOSIS — I2510 Atherosclerotic heart disease of native coronary artery without angina pectoris: Secondary | ICD-10-CM | POA: Insufficient documentation

## 2017-12-16 DIAGNOSIS — I252 Old myocardial infarction: Secondary | ICD-10-CM | POA: Insufficient documentation

## 2017-12-16 DIAGNOSIS — I493 Ventricular premature depolarization: Secondary | ICD-10-CM

## 2017-12-16 DIAGNOSIS — Z7982 Long term (current) use of aspirin: Secondary | ICD-10-CM | POA: Diagnosis not present

## 2017-12-16 DIAGNOSIS — Z79899 Other long term (current) drug therapy: Secondary | ICD-10-CM | POA: Diagnosis not present

## 2017-12-16 DIAGNOSIS — Z8249 Family history of ischemic heart disease and other diseases of the circulatory system: Secondary | ICD-10-CM | POA: Insufficient documentation

## 2017-12-16 DIAGNOSIS — I10 Essential (primary) hypertension: Secondary | ICD-10-CM

## 2017-12-16 DIAGNOSIS — K219 Gastro-esophageal reflux disease without esophagitis: Secondary | ICD-10-CM | POA: Insufficient documentation

## 2017-12-16 DIAGNOSIS — Z933 Colostomy status: Secondary | ICD-10-CM | POA: Diagnosis not present

## 2017-12-16 DIAGNOSIS — I130 Hypertensive heart and chronic kidney disease with heart failure and stage 1 through stage 4 chronic kidney disease, or unspecified chronic kidney disease: Secondary | ICD-10-CM | POA: Diagnosis not present

## 2017-12-16 DIAGNOSIS — G4733 Obstructive sleep apnea (adult) (pediatric): Secondary | ICD-10-CM | POA: Insufficient documentation

## 2017-12-16 DIAGNOSIS — N183 Chronic kidney disease, stage 3 (moderate): Secondary | ICD-10-CM | POA: Insufficient documentation

## 2017-12-16 MED ORDER — LOSARTAN POTASSIUM 50 MG PO TABS: 50.0000 mg | ORAL_TABLET | Freq: Two times a day (BID) | ORAL | 6 refills | Status: DC

## 2017-12-16 MED ORDER — POTASSIUM CHLORIDE CRYS ER 20 MEQ PO TBCR: 20.0000 meq | EXTENDED_RELEASE_TABLET | Freq: Every day | ORAL | 3 refills | Status: DC

## 2017-12-16 MED ORDER — FUROSEMIDE 20 MG PO TABS: 20.0000 mg | ORAL_TABLET | Freq: Every day | ORAL | 3 refills | Status: DC

## 2017-12-16 NOTE — Progress Notes (Signed)
  Echocardiogram 2D Echocardiogram has been performed.  [name] F 12/16/2017, 9:06 AM

## 2017-12-16 NOTE — Patient Instructions (Signed)
Increase Losartan to 50 mg Twice daily   Start Furosemide 20 mg daily  Start Potassium (K-dur) 20 meq daily  Labs and nurse visit in 1 week  Your physician recommends that you schedule a follow-up appointment in: 2 months

## 2017-12-16 NOTE — Progress Notes (Signed)
Advanced Heart Failure Clinic Note    Primary Care: [facility] Primary Cardiologist: Dr. Radford Pax    HPI: Holman Bonsignore is a 67 y.o. male with history of GERD, colon CA s/p colostomy, HTN, CKD stage III, nonobstructive CAD, PVCs, orthostatic hypotension and junctional rhythm. Also with history of newly diagnosed systolic CHF, echo in 07/6107 with EF 20-25% grade 2 DD, mild AI, mild-mod MR, mod TR, PASP 40.   Admitted 12/19/16-12/22/16 with DOE, nausea and vomiting. Troponin 0.26, EKG showed sinus bradycardia with diffuse T wave abnormalities c/w inferolateral ischemia. Left heart cath showed nonobstructive CAD. Noted to have frequent PVC's on tele. He was started on Entresto at discharge.   Admitted 12/23/16-12/27/16 with near syncope, nausea and SOB. He was orthostatic sitting BP 136/95, standing 91/56. His beta blocker was stopped because of bradycardia and some junctional rhythms noted on tele. Frequent PVC's continued on tele. His Entresto was stopped. Discharge weight was 208 pounds.   cMRI 02/09/17 LVEF 20%, Normal RV, No LGE.   Was started on amiodarone. Recent monitor with PVC burden down to 5% on 10/18. Echo 11/18 EF 30-35%   Echo today EF ~45% RV normal Personally reviewed   He presents today for regular follow up. Wife has been in the hospital with hiatal hernia repair. Has missed some doses of his medicines. Saw Dr. Radford Pax earlier this month and carvedilol increased. Also saw Dr. Curt Bears and planning on PVC ablation Feels good. No SOB. But BP has been up and is swelling more. No orthopnea or PND. No CP. Missed CPAP for a week. At last visit I increased losartan to 50 bid but still taking 25 bid. .     Review of systems complete and found to be negative unless listed in HPI.    Past Medical History:  Diagnosis Date  . Back pain   . Chronic combined systolic and diastolic heart failure ([place]) 12/24/2016  . Colon cancer [facility])    s/p colostomy  . GERD (gastroesophageal  reflux disease)    with associated esophagitis on PPI  . Hyperlipidemia   . Hypertension   . Junctional rhythm   . Myocardial infarction ([place])   . NICM (nonischemic cardiomyopathy) ([place])   . Nonobstructive atherosclerosis of coronary artery    a. 12/21/16- Cath: non-obstructive CAD, EF <25%.  . Orthostatic hypotension   . OSA on CPAP 10/26/2017   Moderate with AHI 28/hr now on CPAP at 11cm H2O.    . PVC's (premature ventricular contractions)     Current Outpatient Medications  Medication Sig Dispense Refill  . amiodarone (PACERONE) 200 MG tablet Take 1 tablet (200 mg total) by mouth daily. 30 tablet 11  . aspirin 81 MG EC tablet Take 1 tablet (81 mg total) by mouth daily. 30 tablet 11  . carvedilol (COREG) 6.25 MG tablet Take 1 tablet (6.25 mg total) 2 (two) times daily with a meal by mouth. 180 tablet 3  . co-enzyme Q-10 30 MG capsule Take 30 mg by mouth daily.    Marland Kitchen losartan (COZAAR) 25 MG tablet Take 1 tablet (25 mg total) by mouth 2 (two) times daily. 60 tablet 3  . rosuvastatin (CRESTOR) 20 MG tablet Take 20 mg by mouth daily.    Marland Kitchen spironolactone (ALDACTONE) 25 MG tablet Take 1 tablet (25 mg total) by mouth daily. 30 tablet 3  . nitroGLYCERIN (NITROSTAT) 0.4 MG SL tablet Place 1 tablet (0.4 mg total) under the tongue every 5 (five) minutes x 3 doses as needed for  chest pain. (Patient not taking: Reported on 12/16/2017) 25 tablet 12   No current facility-administered medications for this encounter.     Allergies  Allergen Reactions  . Aspirin Other (See Comments)    "stomach irritation", GI bleeding, also mentions colon CA      Social History   Socioeconomic History  . Marital status: Married    Spouse name: Not on file  . Number of children: Not on file  . Years of education: Not on file  . Highest education level: Not on file  Occupational History  . Not on file  Social Needs  . Financial resource strain: Not on file  . Food insecurity:    Worry: Not on file     Inability: Not on file  . Transportation needs:    Medical: Not on file    Non-medical: Not on file  Tobacco Use  . Smoking status: Never Smoker  . Smokeless tobacco: Never Used  Substance and Sexual Activity  . Alcohol use: No  . Drug use: No  . Sexual activity: Never  Lifestyle  . Physical activity:    Days per week: Not on file    Minutes per session: Not on file  . Stress: Not on file  Relationships  . Social connections:    Talks on phone: Not on file    Gets together: Not on file    Attends religious service: Not on file    Active member of club or organization: Not on file    Attends meetings of clubs or organizations: Not on file    Relationship status: Not on file  . Intimate partner violence:    Fear of current or ex partner: Not on file    Emotionally abused: Not on file    Physically abused: Not on file    Forced sexual activity: Not on file  Other Topics Concern  . Not on file  Social History Narrative  . Not on file      Family History  Problem Relation Age of Onset  . Colon cancer Mother   . Deep vein thrombosis Father   . Heart disease Brother   . Heart attack Brother     Vitals:   12/16/17 0905  BP: (!) 198/104  Pulse: (!) 59  SpO2: 100%  Weight: 221 lb 6.4 oz (100.4 kg)   Wt Readings from Last 3 Encounters:  12/16/17 221 lb 6.4 oz (100.4 kg)  11/15/17 222 lb 9.6 oz (101 kg)  10/26/17 222 lb (100.7 kg)    PHYSICAL EXAM: General:  Well appearing. No resp difficulty HEENT: normal Neck: supple.JVP 8. Carotids 2+ bilat; no bruits. No lymphadenopathy or thryomegaly appreciated. Cor: PMI nondisplaced. Regular rate & rhythm. No rubs, gallops or murmurs. Lungs: clear Abdomen: soft, nontender, nondistended. No hepatosplenomegaly. No bruits or masses. Good bowel sounds. Extremities: no cyanosis, clubbing, rash, 1-2+ edema Neuro: alert & orientedx3, cranial nerves grossly intact. moves all 4 extremities w/o difficulty. Affect  pleasant   ASSESSMENT & PLAN:  1. Acute on chronic systolic HF - due to NICM -> likely PVC-mediated +/- HTN or OSA. EF  20-25%. Cath 5/18. Non-obstructive CAD - cMRI 7/18 with LVEF 20%, normal RV, and no LGE - echo 11/18 EF 30-35%. No ICD yet as EF improving.  - Echo today EF 45% improving with PVC suppression. Personally reviewed - Stable NYHA II symptoms. Volume status elevated in setting of HTN and missing meds - PVC burden down to 5% on monitor 10/18. Continue  amio. Has seen Dr. Curt Bears and is planning PVC abaltion - Continue spironolactone 25 daily.  - Increase losartan to 50 bid. Did not tolerate entresto 24/26 with hypotension.  - Continue carvedilol 6.25 bid - Start lasix 20 daily with kcl 20  - Reinforced fluid restriction to < 2 L daily, sodium restriction to less than 2000 mg daily, and the importance of daily weights.    2. CAD, nonobstructive - Continue ASA and statin.  - No s/s ischemia  3. Snoring - Failed sleep study. Now on CPAP. Encouraged compliance  4. PVCs - Burden > 10% on amio via Holter monitor 02/18/17. Has appt with EP in 2 weeks - Holter 10/18 PVC burden 5%.  - See discussion above. Continue amio for now.   4. HTN - Elevated today.  Increase losartan to 50 bid. Stressed need for med complaince. - RTC next week for BP check and BMET  Glori Bickers, MD  9:43 AM

## 2017-12-20 ENCOUNTER — Emergency Department (HOSPITAL_BASED_OUTPATIENT_CLINIC_OR_DEPARTMENT_OTHER)
Admission: EM | Admit: 2017-12-20 | Discharge: 2017-12-20 | Disposition: A | Discharge status: Home / Self Care | Payer: Medicare HMO | Attending: Emergency Medicine | Admitting: Emergency Medicine

## 2017-12-20 ENCOUNTER — Encounter (HOSPITAL_BASED_OUTPATIENT_CLINIC_OR_DEPARTMENT_OTHER): Payer: Self-pay | Admitting: Emergency Medicine

## 2017-12-20 ENCOUNTER — Other Ambulatory Visit: Payer: Self-pay

## 2017-12-20 ENCOUNTER — Emergency Department (HOSPITAL_BASED_OUTPATIENT_CLINIC_OR_DEPARTMENT_OTHER): Payer: Medicare HMO

## 2017-12-20 DIAGNOSIS — I110 Hypertensive heart disease with heart failure: Secondary | ICD-10-CM | POA: Diagnosis not present

## 2017-12-20 DIAGNOSIS — Z7982 Long term (current) use of aspirin: Secondary | ICD-10-CM | POA: Diagnosis not present

## 2017-12-20 DIAGNOSIS — K8020 Calculus of gallbladder without cholecystitis without obstruction: Secondary | ICD-10-CM | POA: Diagnosis not present

## 2017-12-20 DIAGNOSIS — N200 Calculus of kidney: Secondary | ICD-10-CM

## 2017-12-20 DIAGNOSIS — R1011 Right upper quadrant pain: Secondary | ICD-10-CM | POA: Diagnosis not present

## 2017-12-20 DIAGNOSIS — R1084 Generalized abdominal pain: Secondary | ICD-10-CM | POA: Diagnosis present

## 2017-12-20 DIAGNOSIS — I5042 Chronic combined systolic (congestive) and diastolic (congestive) heart failure: Secondary | ICD-10-CM | POA: Diagnosis not present

## 2017-12-20 DIAGNOSIS — Z79899 Other long term (current) drug therapy: Secondary | ICD-10-CM | POA: Insufficient documentation

## 2017-12-20 LAB — COMPREHENSIVE METABOLIC PANEL
ALT: 20 U/L (ref 17–63)
AST: 24 U/L (ref 15–41)
Albumin: 4.5 g/dL (ref 3.5–5.0)
Alkaline Phosphatase: 53 U/L (ref 38–126)
Anion gap: 8 (ref 5–15)
BILIRUBIN TOTAL: 0.9 mg/dL (ref 0.3–1.2)
BUN: 19 mg/dL (ref 6–20)
CALCIUM: 9.3 mg/dL (ref 8.9–10.3)
CO2: 22 mmol/L (ref 22–32)
Chloride: 108 mmol/L (ref 101–111)
Creatinine, Ser: 1.8 mg/dL — ABNORMAL HIGH (ref 0.61–1.24)
GFR calc non Af Amer: 38 mL/min — ABNORMAL LOW (ref >60)
GFR, EST AFRICAN AMERICAN: 44 mL/min — AB (ref >60)
Glucose, Bld: 107 mg/dL — ABNORMAL HIGH (ref 65–99)
POTASSIUM: 4.1 mmol/L (ref 3.5–5.1)
SODIUM: 138 mmol/L (ref 135–145)
TOTAL PROTEIN: 8.1 g/dL (ref 6.5–8.1)

## 2017-12-20 LAB — URINALYSIS, ROUTINE W REFLEX MICROSCOPIC
BILIRUBIN URINE: NEGATIVE
Glucose, UA: NEGATIVE mg/dL
KETONES UR: NEGATIVE mg/dL
NITRITE: NEGATIVE
PH: 5 (ref 5.0–8.0)
PROTEIN: 30 mg/dL — AB
Specific Gravity, Urine: >1.03 — ABNORMAL HIGH (ref 1.005–1.030)

## 2017-12-20 LAB — URINALYSIS, MICROSCOPIC (REFLEX)

## 2017-12-20 LAB — CBC
HCT: 42.5 % (ref 39.0–52.0)
HEMOGLOBIN: 15.1 g/dL (ref 13.0–17.0)
MCH: 32.1 pg (ref 26.0–34.0)
MCHC: 35.5 g/dL (ref 30.0–36.0)
MCV: 90.4 fL (ref 78.0–100.0)
Platelets: 219 10*3/uL (ref 150–400)
RBC: 4.7 MIL/uL (ref 4.22–5.81)
RDW: 12.6 % (ref 11.5–15.5)
WBC: 5.8 10*3/uL (ref 4.0–10.5)

## 2017-12-20 LAB — LIPASE, BLOOD: Lipase: 36 U/L (ref 11–51)

## 2017-12-20 MED ORDER — ONDANSETRON HCL 4 MG/2ML IJ SOLN
4.0000 mg | Freq: Once | INTRAMUSCULAR | Status: AC
  Administered 2017-12-20: 4 mg via INTRAVENOUS
  Filled 2017-12-20: qty 2

## 2017-12-20 MED ORDER — ONDANSETRON HCL 4 MG/2ML IJ SOLN
4.0000 mg | Freq: Once | INTRAMUSCULAR | Status: AC | PRN
  Administered 2017-12-20: 4 mg via INTRAVENOUS

## 2017-12-20 MED ORDER — ONDANSETRON 4 MG PO TBDP: ORAL_TABLET | ORAL | 0 refills | Status: DC

## 2017-12-20 MED ORDER — ONDANSETRON HCL 4 MG/2ML IJ SOLN
INTRAMUSCULAR | Status: AC
  Filled 2017-12-20: qty 2

## 2017-12-20 MED ORDER — MORPHINE SULFATE (PF) 4 MG/ML IV SOLN
6.0000 mg | Freq: Once | INTRAVENOUS | Status: AC
  Administered 2017-12-20: 6 mg via INTRAVENOUS
  Filled 2017-12-20: qty 2

## 2017-12-20 MED ORDER — HYDROCODONE-ACETAMINOPHEN 5-325 MG PO TABS: 1.0000 | ORAL_TABLET | ORAL | 0 refills | Status: DC | PRN

## 2017-12-20 MED FILL — HYDROCODON-APAP 5-325: 5-325 | 3 days supply | Qty: 15 | Fill #0

## 2017-12-20 MED FILL — ONDANSETRON ODT 4 MG TABLET: 4 | 1 days supply | Qty: 4 | Fill #0

## 2017-12-20 NOTE — ED Provider Notes (Signed)
[facility] Provider Note   CSN: [id number] Arrival date & time: 12/20/17  1046     History   Chief Complaint Chief Complaint  Patient presents with  . Flank Pain    HPI David Underwood is a 67 y.o. male.  Patient is a 67 year old male with a history of hypertension, hyperlipidemia, CHF, colon cancer status post colostomy.  He presents with right flank pain.  He states is been going on for about 3 weeks but is recently gotten worse over the last few days.  He saw his PCP for the symptoms about 2 weeks ago he states and reports that he had an ultrasound that showed a kidney stone.  He has an upcoming appointment with a urologist.  He states his pain has worsened over the last 2 days and got markedly worse this morning.  He has pain that starts in his left back and radiates to his left mid abdomen.  He has had some associated nausea and vomiting.  Today he has had some difficulty with urination.  He denies any fevers.  He has no change in his colostomy output.  No history of kidney stones in the past.     Past Medical History:  Diagnosis Date  . Back pain   . Chronic combined systolic and diastolic heart failure ([place]) 12/24/2016  . Colon cancer [facility])    s/p colostomy  . GERD (gastroesophageal reflux disease)    with associated esophagitis on PPI  . Hyperlipidemia   . Hypertension   . Junctional rhythm   . Myocardial infarction ([place])   . NICM (nonischemic cardiomyopathy) ([place])   . Nonobstructive atherosclerosis of coronary artery    a. 12/21/16- Cath: non-obstructive CAD, EF <25%.  . Orthostatic hypotension   . OSA on CPAP 10/26/2017   Moderate with AHI 28/hr now on CPAP at 11cm H2O.    . PVC's (premature ventricular contractions)     Patient Active Problem List   Diagnosis Date Noted  . OSA on CPAP 10/26/2017  . PVC's (premature ventricular contractions) 12/27/2016  . Benign essential HTN 12/27/2016  . Chronic combined systolic and  diastolic heart failure ([place]) 12/24/2016  . NICM (nonischemic cardiomyopathy) ([place]) 12/24/2016  . Syncope and collapse 12/23/2016  . Coronary artery disease, non-occlusive with cath 12/21/16 12/22/2016  . Pure hypercholesterolemia     Past Surgical History:  Procedure Laterality Date  . COLON SURGERY    . LEFT HEART CATH AND CORONARY ANGIOGRAPHY N/A 12/21/2016   Procedure: Left Heart Cath and Coronary Angiography;  Surgeon: Martinique, Peter M, MD;  Location: [facility];  Service: Cardiovascular;  Laterality: N/A;        Home Medications    Prior to Admission medications   Medication Sig Start Date End Date Taking? Authorizing Provider  amiodarone (PACERONE) 200 MG tablet Take 1 tablet (200 mg total) by mouth daily. 03/02/17  Yes Lyda Jester M, PA-C  aspirin 81 MG EC tablet Take 1 tablet (81 mg total) by mouth daily. 12/23/16  Yes Carlota Raspberry, Tiffany, PA-C  carvedilol (COREG) 6.25 MG tablet Take 1 tablet (6.25 mg total) 2 (two) times daily with a meal by mouth. 06/10/17 12/20/17 Yes Tillery, Satira Mccallum, PA-C  co-enzyme Q-10 30 MG capsule Take 30 mg by mouth daily.   Yes [provider]  furosemide (LASIX) 20 MG tablet Take 1 tablet (20 mg total) by mouth daily. 12/16/17 03/16/18 Yes Bensimhon, Shaune Pascal, MD  losartan (COZAAR) 50 MG tablet Take 1  tablet (50 mg total) by mouth 2 (two) times daily. 12/16/17  Yes Bensimhon, Shaune Pascal, MD  nitroGLYCERIN (NITROSTAT) 0.4 MG SL tablet Place 1 tablet (0.4 mg total) under the tongue every 5 (five) minutes x 3 doses as needed for chest pain. 12/22/16  Yes Carlota Raspberry, Tiffany, PA-C  potassium chloride SA (K-DUR,KLOR-CON) 20 MEQ tablet Take 1 tablet (20 mEq total) by mouth daily. 12/16/17  Yes Bensimhon, Shaune Pascal, MD  rosuvastatin (CRESTOR) 20 MG tablet Take 20 mg by mouth daily.   Yes [provider]  spironolactone (ALDACTONE) 25 MG tablet Take 1 tablet (25 mg total) by mouth daily. 05/20/17  Yes Bensimhon, Shaune Pascal, MD    HYDROcodone-acetaminophen (NORCO/VICODIN) 5-325 MG tablet Take 1-2 tablets by mouth every 4 (four) hours as needed. 12/20/17   Malvin Johns, MD  ondansetron (ZOFRAN ODT) 4 MG disintegrating tablet 4mg  ODT q4 hours prn nausea/vomit 12/20/17   Malvin Johns, MD    Family History Family History  Problem Relation Age of Onset  . Colon cancer Mother   . Deep vein thrombosis Father   . Heart disease Brother   . Heart attack Brother     Social History Social History   Tobacco Use  . Smoking status: Never Smoker  . Smokeless tobacco: Never Used  Substance Use Topics  . Alcohol use: No  . Drug use: No     Allergies   Aspirin   Review of Systems Review of Systems  Constitutional: Negative for chills, diaphoresis, fatigue and fever.  HENT: Negative for congestion, rhinorrhea and sneezing.   Eyes: Negative.   Respiratory: Negative for cough, chest tightness and shortness of breath.   Cardiovascular: Negative for chest pain and leg swelling.  Gastrointestinal: Positive for abdominal pain, nausea and vomiting. Negative for blood in stool and diarrhea.  Genitourinary: Positive for dysuria and flank pain. Negative for difficulty urinating, frequency and hematuria.  Musculoskeletal: Positive for back pain. Negative for arthralgias.  Skin: Negative for rash.  Neurological: Negative for dizziness, speech difficulty, weakness, numbness and headaches.     Physical Exam Updated Vital Signs BP (!) 178/97   Pulse 61   Temp 98.4 F (36.9 C) (Oral)   Resp 12   Ht 5\' 11"  (1.803 m)   Wt 100.7 kg (222 lb)   SpO2 99%   BMI 30.96 kg/m   Physical Exam  Constitutional: He is oriented to person, place, and time. He appears well-developed and well-nourished.  HENT:  Head: Normocephalic and atraumatic.  Eyes: Pupils are equal, round, and reactive to light.  Neck: Normal range of motion. Neck supple.  Cardiovascular: Normal rate, regular rhythm and normal heart sounds.  Pulmonary/Chest:  Effort normal and breath sounds normal. No respiratory distress. He has no wheezes. He has no rales. He exhibits no tenderness.  Abdominal: Soft. Bowel sounds are normal. There is tenderness (Positive tenderness in the right flank and right mid abdomen). There is no rebound and no guarding.  Musculoskeletal: Normal range of motion. He exhibits edema.  Lymphadenopathy:    He has no cervical adenopathy.  Neurological: He is alert and oriented to person, place, and time.  Skin: Skin is warm and dry. No rash noted.  Psychiatric: He has a normal mood and affect.     ED Treatments / Results  Labs (all labs ordered are listed, but only abnormal results are displayed) Labs Reviewed  COMPREHENSIVE METABOLIC PANEL - Abnormal; Notable for the following components:      Result Value   Glucose,  Bld 107 (*)    Creatinine, Ser 1.80 (*)    GFR calc non Af Amer 38 (*)    GFR calc Af Amer 44 (*)    All other components within normal limits  LIPASE, BLOOD  CBC  URINALYSIS, ROUTINE W REFLEX MICROSCOPIC    EKG EKG Interpretation  Date/Time:  Tuesday Dec 20 2017 12:04:45 EDT Ventricular Rate:  55 PR Interval:    QRS Duration: 124 QT Interval:  458 QTC Calculation: 439 R Axis:   121 Text Interpretation:  Sinus rhythm Borderline prolonged PR interval Nonspecific intraventricular conduction delay Anterior infarct, old Nonspecific T abnormalities, lateral leads since last tracing no significant change Confirmed by Malvin Johns [phone]) on 12/20/2017 12:16:00 PM   Radiology Ct Renal Stone Study  Result Date: 12/20/2017 CLINICAL DATA:  Right flank pain over the last week.  Nausea. EXAM: CT ABDOMEN AND PELVIS WITHOUT CONTRAST TECHNIQUE: Multidetector CT imaging of the abdomen and pelvis was performed following the standard protocol without IV contrast. COMPARISON:  08/10/2003 FINDINGS: Lower chest: Mild scarring or atelectasis at the left lung base. No pleural fluid. Hepatobiliary: Chronic appearing  atrophic/fibrotic changes of the left lobe of the liver, a region previously affected by ductal dilatation. Right lobe appears normal. There are multiple gallstones filling the gallbladder. Cannot rule out possibility of a gallbladder mass. Consider ultrasound. Pancreas: Fatty changes of the pancreas. No evidence of pancreatic mass. Spleen: Spleen is normal. Adrenals/Urinary Tract: Mild hypertrophic changes of the left adrenal gland without focal mass. Left kidney is normal. Right kidney contains a nonobstructing 19 mm stone in the lower pole and some calcified debris in the inferior calices. There is hydroureteronephrosis on the right with the ureter being dilated to the mid pelvis where there is a low opacity stone likely responsible for the obstruction. There could be a few small stone fragments present within the bladder. No evidence of renal mass lesion. Stomach/Bowel: Patient has had colectomy. Right abdominal ostomy appears unremarkable. No evidence of mass disease. Vascular/Lymphatic: Aortic atherosclerosis. IVC is normal. No retroperitoneal adenopathy. Reproductive: Normal Other: No free fluid or air. Musculoskeletal: Negative IMPRESSION: Hydroureteronephrosis on the right probably due to a small low opacity stone in the distal right ureter approximately 1-2 cm from the bladder. There may be some small stone fragments in the bladder. There is a 19 mm nonobstructing stone in the right kidney and there is some indistinct stone material in the lower pole calices. Gallbladder contains multiple stones. Cannot rule out the possibility of a gallbladder mass. Consider ultrasound. Patient has developed progressive atrophic change/fibrosis of the lateral segment of the left lobe of the liver. Previous colectomy.  Right abdominal ileostomy. Aortic atherosclerosis. Electronically Signed   By: Nelson Chimes M.D.   On: 12/20/2017 12:15   US Abdomen Limited Ruq  Result Date: 12/20/2017 CLINICAL DATA:  Right upper  quadrant pain and nausea and vomiting EXAM: ULTRASOUND ABDOMEN LIMITED RIGHT UPPER QUADRANT COMPARISON:  12/20/2017 FINDINGS: Gallbladder: Multiple gallstones are identified. No gallbladder wall thickening or pericholecystic fluid is noted. Stones are noted in the region of the gallbladder neck. Common bile duct: Diameter: 3.5 mm Liver: No focal lesion identified. Within normal limits in parenchymal echogenicity. Portal vein is patent on color Doppler imaging with normal direction of blood flow towards the liver. IMPRESSION: Multiple gallstones without gallbladder wall thickening or pericholecystic fluid. Electronically Signed   By: Inez Catalina M.D.   On: 12/20/2017 14:51    Procedures Procedures (including critical care time)  Medications Ordered  in ED Medications  ondansetron (ZOFRAN) injection 4 mg (4 mg Intravenous Given 12/20/17 1136)  morphine 4 MG/ML injection 6 mg (6 mg Intravenous Given 12/20/17 1200)  ondansetron (ZOFRAN) injection 4 mg (4 mg Intravenous Given 12/20/17 1325)     Initial Impression / Assessment and Plan / ED Course  I have reviewed the triage vital signs and the nursing notes.  Pertinent labs & imaging results that were available during my care of the patient were reviewed by me and considered in my medical decision making (see chart for details).     Patient is a 67 year old male who presents with right flank pain.  CT scan shows evidence of a small distal right ureteral stone with hydronephrosis.  Patient's creatinine is not significantly elevated over baseline.  His baseline appears to be around 1.4.  His pain is controlled in the ED.  He has no fevers or signs of infection.  He was discharged home in good condition.  A gallbladder ultrasound was performed as there was gallstones and possible gallbladder mass on CT.  Ultrasound shows gallstones but no mass and no signs of cholecystitis.  I feel his pain today is more likely related to the kidney stone.  He has an  appointment in 2 days to follow-up with the urologist.  He was given a prescription for Vicodin and Zofran for symptomatic relief at home.  Return precautions were given.  I did talk to him about his gallstones and that he may have issues with this but his pain today seems more related to his kidney stone.  Final Clinical Impressions(s) / ED Diagnoses   Final diagnoses:  RUQ pain  Kidney stone  Gallstones    ED Discharge Orders        Ordered    HYDROcodone-acetaminophen (NORCO/VICODIN) 5-325 MG tablet  Every 4 hours PRN     12/20/17 1526    ondansetron (ZOFRAN ODT) 4 MG disintegrating tablet     12/20/17 1526       Malvin Johns, MD 12/20/17 1528

## 2017-12-20 NOTE — ED Triage Notes (Signed)
PT presents with c/o right flank pain for about a week or so and nausea

## 2017-12-20 NOTE — ED Notes (Signed)
Pt attempted to collect urine specimen but was unable.

## 2017-12-20 NOTE — ED Notes (Signed)
Requested urine specimen again.

## 2017-12-22 ENCOUNTER — Other Ambulatory Visit (HOSPITAL_COMMUNITY): Payer: Medicare HMO

## 2017-12-22 ENCOUNTER — Encounter (HOSPITAL_COMMUNITY): Payer: Medicare HMO

## 2017-12-23 ENCOUNTER — Ambulatory Visit (HOSPITAL_COMMUNITY)
Admission: RE | Admit: 2017-12-23 | Discharge: 2017-12-23 | Disposition: A | Discharge status: Home / Self Care | Payer: Medicare HMO | Source: Ambulatory Visit | Attending: Internal Medicine | Admitting: Internal Medicine

## 2017-12-23 DIAGNOSIS — I5042 Chronic combined systolic (congestive) and diastolic (congestive) heart failure: Secondary | ICD-10-CM | POA: Insufficient documentation

## 2017-12-23 LAB — BASIC METABOLIC PANEL
ANION GAP: 6 (ref 5–15)
BUN: 26 mg/dL — AB (ref 6–20)
CALCIUM: 9.1 mg/dL (ref 8.9–10.3)
CO2: 21 mmol/L — AB (ref 22–32)
Chloride: 111 mmol/L (ref 101–111)
Creatinine, Ser: 2.35 mg/dL — ABNORMAL HIGH (ref 0.61–1.24)
GFR calc Af Amer: 32 mL/min — ABNORMAL LOW (ref >60)
GFR, EST NON AFRICAN AMERICAN: 27 mL/min — AB (ref >60)
GLUCOSE: 102 mg/dL — AB (ref 65–99)
Potassium: 4.3 mmol/L (ref 3.5–5.1)
Sodium: 138 mmol/L (ref 135–145)

## 2017-12-23 NOTE — Progress Notes (Signed)
Checked patient's BP manually with large cuff as it was recently elevated last visit. 136/84 left arm with large cuff

## 2017-12-23 NOTE — Addendum Note (Signed)
Encounter addended by: Effie Berkshire, RN on: 12/23/2017 10:08 AM  Actions taken: Sign clinical note, Vitals modified

## 2017-12-27 ENCOUNTER — Telehealth (HOSPITAL_COMMUNITY): Payer: Self-pay | Admitting: *Deleted

## 2017-12-27 DIAGNOSIS — I5042 Chronic combined systolic (congestive) and diastolic (congestive) heart failure: Secondary | ICD-10-CM

## 2017-12-27 NOTE — Telephone Encounter (Signed)
Notes recorded by Scarlette Calico, RN on 12/27/2017 at 3:08 PM EDT Pt aware and agreeable, he states he has a kidney stone and is seeing urology tomorrow, repeat labs sch for 6/7

## 2017-12-27 NOTE — Telephone Encounter (Signed)
-----   Message from Jolaine Artist, MD sent at 12/25/2017 12:49 PM EDT ----- Creatinine up  Recheck this week.

## 2017-12-30 ENCOUNTER — Other Ambulatory Visit (HOSPITAL_COMMUNITY): Payer: Medicare HMO

## 2017-12-30 ENCOUNTER — Telehealth: Payer: Self-pay | Admitting: *Deleted

## 2017-12-30 NOTE — Telephone Encounter (Signed)
Pt scheduled for 7/3 to review PVC ablation w/ physician. (holding 7/25 date for procedure) Pt understands we will schedule procedure that day, if still plan of care once discussing with Dr. Curt Bears. Patient verbalized understanding and agreeable to plan.

## 2018-01-02 ENCOUNTER — Ambulatory Visit (HOSPITAL_COMMUNITY)
Admission: RE | Admit: 2018-01-02 | Discharge: 2018-01-02 | Disposition: A | Discharge status: Home / Self Care | Payer: Medicare HMO | Source: Ambulatory Visit | Attending: Cardiology | Admitting: Cardiology

## 2018-01-02 DIAGNOSIS — I5042 Chronic combined systolic (congestive) and diastolic (congestive) heart failure: Secondary | ICD-10-CM | POA: Insufficient documentation

## 2018-01-02 LAB — BASIC METABOLIC PANEL
Anion gap: 9 (ref 5–15)
BUN: 21 mg/dL — ABNORMAL HIGH (ref 6–20)
CALCIUM: 9.5 mg/dL (ref 8.9–10.3)
CO2: 25 mmol/L (ref 22–32)
Chloride: 106 mmol/L (ref 101–111)
Creatinine, Ser: 1.76 mg/dL — ABNORMAL HIGH (ref 0.61–1.24)
GFR, EST AFRICAN AMERICAN: 45 mL/min — AB (ref >60)
GFR, EST NON AFRICAN AMERICAN: 39 mL/min — AB (ref >60)
GLUCOSE: 97 mg/dL (ref 65–99)
POTASSIUM: 3.9 mmol/L (ref 3.5–5.1)
Sodium: 140 mmol/L (ref 135–145)

## 2018-01-03 ENCOUNTER — Other Ambulatory Visit (HOSPITAL_COMMUNITY): Payer: Medicare HMO

## 2018-01-03 ENCOUNTER — Telehealth (HOSPITAL_COMMUNITY): Payer: Self-pay | Admitting: *Deleted

## 2018-01-03 MED ORDER — LOSARTAN POTASSIUM 25 MG PO TABS: 25.0000 mg | ORAL_TABLET | Freq: Two times a day (BID) | ORAL | 3 refills | Status: DC

## 2018-01-03 NOTE — Telephone Encounter (Signed)
Pt had labwork yesterday and wanted to inform Dr.McLean that Losartan 50mg  bid was dropping his blood pressure to 80's/50's and he felt bad. Patient stopped taking medication and felt a lot better but his bp was 147/84. Per Dr.McLean patient should take Losartan 25mg  bid. Pt aware and agreeable with plan . New script sent to pharmacy.

## 2018-01-24 NOTE — Progress Notes (Signed)
Electrophysiology Office Note   Date:  01/25/2018   ID:  Arham, Symmonds 1951-02-28, MRN [id number]  PCP:  [place]  Cardiologist:  Radford Pax Primary Electrophysiologist:  Mirranda Monrroy Meredith Leeds, MD    Chief Complaint  Patient presents with  . Follow-up    PVC's/H&P and pre procedure labs     History of Present Illness: David Underwood is a 67 y.o. male who is being seen today for the evaluation of CHF, PVCs at the request of [facility]. Presenting today for electrophysiology evaluation. He has a history of GERD, colon cancer status post colostomy, hypertension, CKG stage III, recently diagnosed nonischemic cardiomyopathy, nonobstructive coronary disease, PVCs, orthostatic hypotension, and junctional rhythm. Echo 11/2016 showed an EF of 20-25%.   He was initially on both beta-blockers and Entresto but these were stopped due to bradycardia and orthostatic hypotension.  Repeat cardiac monitor after initiation of amiodarone showed an improved  PVC burden at 5%.  An ICD was not implanted as his ejection fraction had improved.  He was started on losartan and low-dose carvedilol which he has been tolerating.  He has since been diagnosed with sleep apnea.  He wears CPAP.  Today, denies symptoms of palpitations, chest pain, shortness of breath, orthopnea, PND, lower extremity edema, claudication, dizziness, presyncope, syncope, bleeding, or neurologic sequela. The patient is tolerating medications without difficulties.  Overall he is felt well.  He has had no complaints.  He is recently had some dental work done and also had a uroscopy for bladder and kidney stones.  Aside from that he is done well.   Past Medical History:  Diagnosis Date  . Back pain   . Chronic combined systolic and diastolic heart failure ([place]) 12/24/2016  . Colon cancer [facility])    s/p colostomy  . GERD (gastroesophageal reflux disease)    with associated esophagitis on PPI  . Hyperlipidemia     . Hypertension   . Junctional rhythm   . Myocardial infarction ([place])   . NICM (nonischemic cardiomyopathy) ([place])   . Nonobstructive atherosclerosis of coronary artery    a. 12/21/16- Cath: non-obstructive CAD, EF <25%.  . Orthostatic hypotension   . OSA on CPAP 10/26/2017   Moderate with AHI 28/hr now on CPAP at 11cm H2O.    . PVC's (premature ventricular contractions)    Past Surgical History:  Procedure Laterality Date  . COLON SURGERY    . LEFT HEART CATH AND CORONARY ANGIOGRAPHY N/A 12/21/2016   Procedure: Left Heart Cath and Coronary Angiography;  Surgeon: Martinique, Peter M, MD;  Location: [facility];  Service: Cardiovascular;  Laterality: N/A;     Current Outpatient Medications  Medication Sig Dispense Refill  . amoxicillin (AMOXIL) 875 MG tablet Take 875 mg by mouth. For 6 days  0  . aspirin 81 MG EC tablet Take 1 tablet (81 mg total) by mouth daily. 30 tablet 11  . carvedilol (COREG) 6.25 MG tablet Take 1 tablet (6.25 mg total) 2 (two) times daily with a meal by mouth. 180 tablet 3  . co-enzyme Q-10 30 MG capsule Take 30 mg by mouth daily.    . furosemide (LASIX) 20 MG tablet Take 1 tablet (20 mg total) by mouth daily. 30 tablet 3  . HYDROcodone-acetaminophen (NORCO/VICODIN) 5-325 MG tablet Take 1-2 tablets by mouth every 4 (four) hours as needed. 15 tablet 0  . nitroGLYCERIN (NITROSTAT) 0.4 MG SL tablet Place 1 tablet (0.4 mg total) under the tongue every 5 (five) minutes  x 3 doses as needed for chest pain. 25 tablet 12  . potassium chloride SA (K-DUR,KLOR-CON) 20 MEQ tablet Take 1 tablet (20 mEq total) by mouth daily. 30 tablet 3  . rosuvastatin (CRESTOR) 20 MG tablet Take 20 mg by mouth daily.    Marland Kitchen spironolactone (ALDACTONE) 25 MG tablet Take 1 tablet (25 mg total) by mouth daily. 30 tablet 3   No current facility-administered medications for this visit.     Allergies:   Aspirin   Social History:  The patient  reports that he has never smoked. He has never used  smokeless tobacco. He reports that he does not drink alcohol or use drugs.   Family History:  The patient's family history includes Colon cancer in his mother; Deep vein thrombosis in his father; Heart attack in his brother; Heart disease in his brother.   ROS:  Please see the history of present illness.   Otherwise, review of systems is positive for none.   All other systems are reviewed and negative.   PHYSICAL EXAM: VS:  BP 112/82   Pulse 60   Ht 5\' 11"  (1.803 m)   Wt 205 lb (93 kg)   BMI 28.59 kg/m  , BMI Body mass index is 28.59 kg/m. GEN: Well nourished, well developed, in no acute distress  HEENT: normal  Neck: no JVD, carotid bruits, or masses Cardiac: RRR; no murmurs, rubs, or gallops,no edema  Respiratory:  clear to auscultation bilaterally, normal work of breathing GI: soft, nontender, nondistended, + BS MS: no deformity or atrophy  Skin: warm and dry Neuro:  Strength and sensation are intact Psych: euthymic mood, full affect  EKG:  EKG is not ordered today. Personal review of the ekg ordered 12/21/17 shows sinus rhythm, first-degree AV block, septal infarct  Recent Labs: 06/10/2017: B Natriuretic Peptide 15.7 11/15/2017: TSH 1.650 12/20/2017: ALT 20; Hemoglobin 15.1; Platelets 219 01/02/2018: BUN 21; Creatinine, Ser 1.76; Potassium 3.9; Sodium 140    Lipid Panel     Component Value Date/Time   CHOL 119 02/08/2017 0902   TRIG 61 02/08/2017 0902   HDL 54 02/08/2017 0902   CHOLHDL 2.2 02/08/2017 0902   CHOLHDL 3.5 12/20/2016 0454   VLDL 11 12/20/2016 0454   LDLCALC 53 02/08/2017 0902     Wt Readings from Last 3 Encounters:  01/25/18 205 lb (93 kg)  12/20/17 222 lb (100.7 kg)  12/16/17 221 lb 6.4 oz (100.4 kg)      Other studies Reviewed: Additional studies/ records that were reviewed today include: TTE 12/16/17 Review of the above records today demonstrates:  - Left ventricle: The cavity size was normal. Wall thickness was   increased in a pattern of  mild LVH. Systolic function was mildly   to moderately reduced. The estimated ejection fraction was in the   range of 40% to 45%. Diffuse hypokinesis. There was no evidence   of elevated ventricular filling pressure by Doppler parameters. - Aortic valve: There was trivial regurgitation. - Mitral valve: There was mild regurgitation. - Right ventricle: The cavity size was mildly dilated. Wall   thickness was normal.  Cath 12/21/16  Mid RCA lesion, 30 %stenosed.  RPDA-2 lesion, 50 %stenosed.  RPDA-1 lesion, 50 %stenosed.  Prox LAD to Mid LAD lesion, 30 %stenosed.  Ost 2nd Mrg to 2nd Mrg lesion, 15 %stenosed.  There is severe left ventricular systolic dysfunction.  LV end diastolic pressure is normal.  The left ventricular ejection fraction is less than 25% by visual estimate.  1. Nonobstructive CAD 2. Severe LV dysfunction 3. Normal LVEDP  Holter 02/22/17 - personally reviewed  Normal sinus rhythm and sinus tachycardia. The average heart rate was 72bpm and the heart rate ranged from 45-119bpm.  Frequent PVCs, ventricular couplets, ventricular bigeminy, multifocal ventricular salvos.  PVC load was 10%  CMRI 02/09/17 1. Mildly dilated LV with mild concentric LVH. Diffuse hypokinesis with EF 20%.  2.  Normal RV size and systolic function.  3. No myocardial LGE, so no definitive evidence for prior MI, infiltrative disease, or myocarditis.  Holter 05/16/17 - personally reviewed Minimum HR: 40 BPM at 4:17:37 AM(2) Maximum HR: 128 BPM at 4:21:48 PM Average HR: 68 BPM 5% PVCs <1% APCs Sinus rhythm Zero atrial fibrillation  ASSESSMENT AND PLAN:  1.  Nonischemic cardiomyopathy: On Coreg, Aldactone, and losartan.  Ejection fraction has improved.  No changes.    2. Obstructive sleep apnea: CPAP compliance encouraged  3. Nonobstructive coronary artery disease: No current chest pain  4. 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  I have reviewed all of his EKGs since being started  on the amiodarone and he has had minimal PVCs.  He is also not having much in the way of symptoms.  His ejection fraction has improved with suppression of his PVCs and thus he would likely benefit from some other form of therapy.  As he is quite young I would prefer him not to be on amiodarone.  We Yuli Lanigan stop it today and see him back in 3 months with an assessment of his PVC burden.  If his PVCs do return, he would likely benefit from ablation at that time.  I am worried that if we plan for ablation now with his low burden of PVCs, he would not have a successful procedure.  5.  Hypertension: Currently well controlled.  No changes.  6.  Hyperlipidemia: Continue Crestor  Current medicines are reviewed at length with the patient today.   The patient does not have concerns regarding his medicines.  The following changes were made today: Stop amiodarone  Labs/ tests ordered today include:  No orders of the defined types were placed in this encounter.    Disposition:   FU with Jimmie Dattilio 3 month  Signed, Shalin Vonbargen Meredith Leeds, MD  01/25/2018 11:16 AM     [facility] [address] [place] [address] [phone] (office) [phone] (fax)

## 2018-01-25 ENCOUNTER — Encounter: Payer: Self-pay | Admitting: Cardiology

## 2018-01-25 ENCOUNTER — Ambulatory Visit: Payer: Medicare HMO | Admitting: Cardiology

## 2018-01-25 VITALS — BP 112/82 | HR 60 | Ht 71.0 in | Wt 205.0 lb

## 2018-01-25 DIAGNOSIS — E785 Hyperlipidemia, unspecified: Secondary | ICD-10-CM

## 2018-01-25 DIAGNOSIS — G4733 Obstructive sleep apnea (adult) (pediatric): Secondary | ICD-10-CM

## 2018-01-25 DIAGNOSIS — I493 Ventricular premature depolarization: Secondary | ICD-10-CM | POA: Diagnosis not present

## 2018-01-25 DIAGNOSIS — I5022 Chronic systolic (congestive) heart failure: Secondary | ICD-10-CM | POA: Diagnosis not present

## 2018-01-25 DIAGNOSIS — I10 Essential (primary) hypertension: Secondary | ICD-10-CM | POA: Diagnosis not present

## 2018-01-25 MED ORDER — FUROSEMIDE 20 MG PO TABS: 20.0000 mg | ORAL_TABLET | Freq: Every day | ORAL | 1 refills | Status: DC

## 2018-01-25 MED ORDER — SPIRONOLACTONE 25 MG PO TABS: 25.0000 mg | ORAL_TABLET | Freq: Every day | ORAL | 1 refills | Status: AC

## 2018-01-25 MED ORDER — CARVEDILOL 6.25 MG PO TABS: 6.2500 mg | ORAL_TABLET | Freq: Two times a day (BID) | ORAL | 1 refills | Status: DC

## 2018-01-25 NOTE — Patient Instructions (Signed)
Medication Instructions:  Your physician has recommended you make the following change in your medication:  1. STOP Amiodarone  Labwork: None ordered  Testing/Procedures: None ordered  Follow-Up: Your physician recommends that you schedule a follow-up appointment in: 3 months with Dr. Curt Bears.   * If you need a refill on your cardiac medications before your next appointment, please call your pharmacy.   *Please note that any paperwork needing to be filled out by the provider will need to be addressed at the front desk prior to seeing the provider. Please note that any FMLA, disability or other documents regarding health condition is subject to a $25.00 charge that must be received prior to completion of paperwork in the form of a money order or check.  Thank you for choosing [facility]!!   Trinidad Curet, RN [phone]

## 2018-02-14 NOTE — Progress Notes (Signed)
Advanced Heart Failure Clinic Note    Primary Care: [facility] Primary Cardiologist: Dr. Radford Pax    HPI: David Underwood is a 67 y.o. male with history of GERD, colon CA s/p colostomy, HTN, CKD stage III, nonobstructive CAD, PVCs, orthostatic hypotension and junctional rhythm. Also with history of newly diagnosed systolic CHF, echo in 03/3266 with EF 20-25% grade 2 DD, mild AI, mild-mod MR, mod TR, PASP 40.   Admitted 12/19/16-12/22/16 with DOE, nausea and vomiting. Troponin 0.26, EKG showed sinus bradycardia with diffuse T wave abnormalities c/w inferolateral ischemia. Left heart cath showed nonobstructive CAD. Noted to have frequent PVC's on tele. He was started on Entresto at discharge.   Admitted 12/23/16-12/27/16 with near syncope, nausea and SOB. He was orthostatic sitting BP 136/95, standing 91/56. His beta blocker was stopped because of bradycardia and some junctional rhythms noted on tele. Frequent PVC's continued on tele. His Entresto was stopped. Discharge weight was 208 pounds.   cMRI 02/09/17 LVEF 20%, Normal RV, No LGE.   Was started on amiodarone. Recent monitor with PVC burden down to 5% on 10/18. Echo 11/18 EF 30-35%   Echo 11/2017: EF ~45% RV normal Personally reviewed by Dr Haroldine Laws  He presents today for regular follow up. Last visit lasix was started and losartan was increased. He saw Dr Curt Bears earlier this month and he stopped his amiodarone with plans to reassess PVC burden in 3 months and plan ablation if they return. Overall doing well. He is not taking any lasix, losartan, or potassium. He says the losartan made him feel dizzy and his blood pressure was in the 90s, so he stopped. He is unsure why he stopped lasix. Weight is down 13 lbs from last visit. Denies SOB with any activity. 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 Denies orthopnea, PND, or edema. Compliant with CPAP. No CP, palpitations, or dizziness. Limits salt and fluid intake. Not  weighing at home. BP check 138/98 at home.   Review of systems complete and found to be negative unless listed in HPI.   Past Medical History:  Diagnosis Date  . Back pain   . Chronic combined systolic and diastolic heart failure ([place]) 12/24/2016  . Colon cancer [facility])    s/p colostomy  . GERD (gastroesophageal reflux disease)    with associated esophagitis on PPI  . Hyperlipidemia   . Hypertension   . Junctional rhythm   . Myocardial infarction ([place])   . NICM (nonischemic cardiomyopathy) ([place])   . Nonobstructive atherosclerosis of coronary artery    a. 12/21/16- Cath: non-obstructive CAD, EF <25%.  . Orthostatic hypotension   . OSA on CPAP 10/26/2017   Moderate with AHI 28/hr now on CPAP at 11cm H2O.    . PVC's (premature ventricular contractions)     Current Outpatient Medications  Medication Sig Dispense Refill  . aspirin 81 MG EC tablet Take 1 tablet (81 mg total) by mouth daily. 30 tablet 11  . carvedilol (COREG) 6.25 MG tablet Take 1 tablet (6.25 mg total) by mouth 2 (two) times daily with a meal. 180 tablet 1  . co-enzyme Q-10 30 MG capsule Take 30 mg by mouth daily.    . nitroGLYCERIN (NITROSTAT) 0.4 MG SL tablet Place 1 tablet (0.4 mg total) under the tongue every 5 (five) minutes x 3 doses as needed for chest pain. 25 tablet 12  . spironolactone (ALDACTONE) 25 MG tablet Take 1 tablet (25 mg total) by mouth daily. 90 tablet 1  No current facility-administered medications for this encounter.     Allergies  Allergen Reactions  . Aspirin Other (See Comments)    "stomach irritation", GI bleeding, also mentions colon CA      Social History   Socioeconomic History  . Marital status: Married    Spouse name: Not on file  . Number of children: Not on file  . Years of education: Not on file  . Highest education level: Not on file  Occupational History  . Not on file  Social Needs  . Financial resource strain: Not on file  . Food insecurity:    Worry: Not on file     Inability: Not on file  . Transportation needs:    Medical: Not on file    Non-medical: Not on file  Tobacco Use  . Smoking status: Never Smoker  . Smokeless tobacco: Never Used  Substance and Sexual Activity  . Alcohol use: No  . Drug use: No  . Sexual activity: Never  Lifestyle  . Physical activity:    Days per week: Not on file    Minutes per session: Not on file  . Stress: Not on file  Relationships  . Social connections:    Talks on phone: Not on file    Gets together: Not on file    Attends religious service: Not on file    Active member of club or organization: Not on file    Attends meetings of clubs or organizations: Not on file    Relationship status: Not on file  . Intimate partner violence:    Fear of current or ex partner: Not on file    Emotionally abused: Not on file    Physically abused: Not on file    Forced sexual activity: Not on file  Other Topics Concern  . Not on file  Social History Narrative  . Not on file      Family History  Problem Relation Age of Onset  . Colon cancer Mother   . Deep vein thrombosis Father   . Heart disease Brother   . Heart attack Brother     Vitals:   02/15/18 1018  BP: 129/88  Pulse: (!) 54  SpO2: 99%  Weight: 208 lb (94.3 kg)   Wt Readings from Last 3 Encounters:  02/15/18 208 lb (94.3 kg)  01/25/18 205 lb (93 kg)  12/20/17 222 lb (100.7 kg)    PHYSICAL EXAM: General: Well appearing. No resp difficulty. HEENT: Normal Neck: Supple. JVP flat. Carotids 2+ bilat; no bruits. No thyromegaly or nodule noted. Cor: PMI nondisplaced. RRR, No M/G/R noted Lungs: CTAB, normal effort. Abdomen: Soft, non-tender, non-distended, no HSM. No bruits or masses. +BS  Extremities: No cyanosis, clubbing, or rash. R and LLE no edema.  Neuro: Alert & orientedx3, cranial nerves grossly intact. moves all 4 extremities w/o difficulty. Affect pleasant  EKG: Sinus brady 54 bpm, no PVCs. Personally reviewed.   ASSESSMENT &  PLAN:  1. Chronic systolic HF - due to NICM -> likely PVC-mediated +/- HTN or OSA. EF  20-25%. Cath 5/18. Non-obstructive CAD - cMRI 7/18 with LVEF 20%, normal RV, and no LGE - echo 11/18 EF 30-35%. No ICD yet as EF improving.  - Echo 11/2017 EF 45% improving with PVC suppression. Personally reviewed - Stable NYHA II symptoms. Volume status stable off lasix. Weight is down 13 lbs.  - He stopped his lasix and potassium. Check BMET today.  - Continue spironolactone 25 daily.  - Restart losartan  12.5 mg daily. He is agreeable to this. Did not tolerate Entresto 24/26 with hypotension - Continue carvedilol 6.25 bid - PVC burden down to 5% on monitor 10/18. Amio stopped by Dr Curt Bears. See discussion below.  - Reinforced fluid restriction to < 2 L daily, sodium restriction to less than 2000 mg daily, and the importance of daily weights.    2. CAD, nonobstructive - Continue ASA and statin.  - No s/s ischemia.  3. OSA - Compliant with CPAP  4. PVCs - Burden > 10% on amio via Holter monitor 02/18/17.  - Holter 10/18 PVC burden 5%.  - Follows with Dr Curt Bears. Amio stopped earlier this month by Dr Curt Bears. Plans to reassess PVC burden in 3 months and plan for ablation if they have returned.  - EKG today shows sinus brady 54 with no PVCs  4. HTN - Stable today. Last check at home was 138/98. Restarting low dose losartan as above  Check BMET today Restart losartan 12.5 mg qHS Follow up in 8 weeks  Georgiana Shore, NP  10:23 AM  Greater than 50% of the 25 minute visit was spent in counseling/coordination of care regarding disease state education, salt/fluid restriction, sliding scale diuretics, and medication compliance.

## 2018-02-15 ENCOUNTER — Ambulatory Visit (HOSPITAL_COMMUNITY)
Admission: RE | Admit: 2018-02-15 | Discharge: 2018-02-15 | Disposition: A | Discharge status: Home / Self Care | Payer: Medicare HMO | Source: Ambulatory Visit | Attending: Internal Medicine | Admitting: Internal Medicine

## 2018-02-15 VITALS — BP 129/88 | HR 54 | Wt 208.0 lb

## 2018-02-15 DIAGNOSIS — I5042 Chronic combined systolic (congestive) and diastolic (congestive) heart failure: Secondary | ICD-10-CM | POA: Diagnosis not present

## 2018-02-15 DIAGNOSIS — Z800 Family history of malignant neoplasm of digestive organs: Secondary | ICD-10-CM | POA: Insufficient documentation

## 2018-02-15 DIAGNOSIS — I10 Essential (primary) hypertension: Secondary | ICD-10-CM | POA: Diagnosis not present

## 2018-02-15 DIAGNOSIS — I5022 Chronic systolic (congestive) heart failure: Secondary | ICD-10-CM | POA: Diagnosis not present

## 2018-02-15 DIAGNOSIS — I252 Old myocardial infarction: Secondary | ICD-10-CM | POA: Insufficient documentation

## 2018-02-15 DIAGNOSIS — Z7982 Long term (current) use of aspirin: Secondary | ICD-10-CM | POA: Diagnosis not present

## 2018-02-15 DIAGNOSIS — G4733 Obstructive sleep apnea (adult) (pediatric): Secondary | ICD-10-CM | POA: Diagnosis not present

## 2018-02-15 DIAGNOSIS — I493 Ventricular premature depolarization: Secondary | ICD-10-CM | POA: Insufficient documentation

## 2018-02-15 DIAGNOSIS — Z886 Allergy status to analgesic agent status: Secondary | ICD-10-CM | POA: Diagnosis not present

## 2018-02-15 DIAGNOSIS — Z85038 Personal history of other malignant neoplasm of large intestine: Secondary | ICD-10-CM | POA: Diagnosis not present

## 2018-02-15 DIAGNOSIS — E785 Hyperlipidemia, unspecified: Secondary | ICD-10-CM | POA: Insufficient documentation

## 2018-02-15 DIAGNOSIS — Z79899 Other long term (current) drug therapy: Secondary | ICD-10-CM | POA: Diagnosis not present

## 2018-02-15 DIAGNOSIS — I130 Hypertensive heart and chronic kidney disease with heart failure and stage 1 through stage 4 chronic kidney disease, or unspecified chronic kidney disease: Secondary | ICD-10-CM | POA: Insufficient documentation

## 2018-02-15 DIAGNOSIS — I2510 Atherosclerotic heart disease of native coronary artery without angina pectoris: Secondary | ICD-10-CM

## 2018-02-15 DIAGNOSIS — Z8249 Family history of ischemic heart disease and other diseases of the circulatory system: Secondary | ICD-10-CM | POA: Diagnosis not present

## 2018-02-15 LAB — BASIC METABOLIC PANEL
ANION GAP: 12 (ref 5–15)
BUN: 9 mg/dL (ref 8–23)
CALCIUM: 9.4 mg/dL (ref 8.9–10.3)
CO2: 22 mmol/L (ref 22–32)
Chloride: 108 mmol/L (ref 98–111)
Creatinine, Ser: 1.53 mg/dL — ABNORMAL HIGH (ref 0.61–1.24)
GFR, EST AFRICAN AMERICAN: 53 mL/min — AB (ref >60)
GFR, EST NON AFRICAN AMERICAN: 46 mL/min — AB (ref >60)
Glucose, Bld: 95 mg/dL (ref 70–99)
Potassium: 4.4 mmol/L (ref 3.5–5.1)
SODIUM: 142 mmol/L (ref 135–145)

## 2018-02-15 MED ORDER — LOSARTAN POTASSIUM 25 MG PO TABS: 12.5000 mg | ORAL_TABLET | Freq: Every day | ORAL | 3 refills | Status: DC

## 2018-02-15 NOTE — Patient Instructions (Signed)
START Losartan 12.5 mg (1/2 tablet) once daily.  Routine lab work today. Will notify you of abnormal results, otherwise no news is good news!  Follow up 8 weeks.  ___________________________________________________________________________ David Underwood Code: 7034  Take all medication as prescribed the day of your appointment. Bring all medications with you to your appointment.  Do the following things EVERYDAY: 1) Weigh yourself in the morning before breakfast. Write it down and keep it in a log. 2) Take your medicines as prescribed 3) Eat low salt foods-Limit salt (sodium) to 2000 mg per day.  4) Stay as active as you can everyday 5) Limit all fluids for the day to less than 2 liters

## 2018-04-17 NOTE — Progress Notes (Signed)
Advanced Heart Failure Clinic Note    Primary Care: [facility] Primary Cardiologist: Dr. Radford Pax    HPI: David Underwood is a 67 y.o. male with history of GERD, colon CA s/p colostomy, HTN, CKD stage III, nonobstructive CAD, PVCs, orthostatic hypotension and junctional rhythm. Also with history of newly diagnosed systolic CHF, echo in 12/4401 with EF 20-25% grade 2 DD, mild AI, mild-mod MR, mod TR, PASP 40.   Admitted 12/19/16-12/22/16 with DOE, nausea and vomiting. Troponin 0.26, EKG showed sinus bradycardia with diffuse T wave abnormalities c/w inferolateral ischemia. Left heart cath showed nonobstructive CAD. Noted to have frequent PVC's on tele. He was started on Entresto at discharge.   Admitted 12/23/16-12/27/16 with near syncope, nausea and SOB. He was orthostatic sitting BP 136/95, standing 91/56. His beta blocker was stopped because of bradycardia and some junctional rhythms noted on tele. Frequent PVC's continued on tele. His Entresto was stopped. Discharge weight was 208 pounds.   cMRI 02/09/17 LVEF 20%, Normal RV, No LGE.   Was started on amiodarone. Recent monitor with PVC burden down to 5% on 10/18. Echo 11/18 EF 30-35%   Echo 11/2017: EF ~45% RV normal Personally reviewed by Dr Haroldine Laws  He presents today for HF follow up. Last visit losartan was restarted. Overall doing well. He is still working part time as a Development worker, international aid. No SOB with work or with hills/stairs. No orthopnea, PND, edema, or cough. No CP, palpitations, or dizziness. Only wearing CPAP 2 nights/week. His wife has had some medical problems and requires help throughout the night. Stopped crestor because he was having muscle weakness, which resolved after stopping. Taking all medications otherwise. Not weighing regularly, but weights ~209 lbs when he checks. SBP 130s at home.   Review of systems complete and found to be negative unless listed in HPI.   Past Medical History:  Diagnosis Date  . Back pain   .  Chronic combined systolic and diastolic heart failure ([place]) 12/24/2016  . Colon cancer [facility])    s/p colostomy  . GERD (gastroesophageal reflux disease)    with associated esophagitis on PPI  . Hyperlipidemia   . Hypertension   . Junctional rhythm   . Myocardial infarction ([place])   . NICM (nonischemic cardiomyopathy) ([place])   . Nonobstructive atherosclerosis of coronary artery    a. 12/21/16- Cath: non-obstructive CAD, EF <25%.  . Orthostatic hypotension   . OSA on CPAP 10/26/2017   Moderate with AHI 28/hr now on CPAP at 11cm H2O.    . PVC's (premature ventricular contractions)     Current Outpatient Medications  Medication Sig Dispense Refill  . aspirin 81 MG EC tablet Take 1 tablet (81 mg total) by mouth daily. 30 tablet 11  . carvedilol (COREG) 6.25 MG tablet Take 1 tablet (6.25 mg total) by mouth 2 (two) times daily with a meal. 180 tablet 1  . co-enzyme Q-10 30 MG capsule Take 30 mg by mouth daily.    Marland Kitchen losartan (COZAAR) 25 MG tablet Take 0.5 tablets (12.5 mg total) by mouth daily. 45 tablet 3  . nitroGLYCERIN (NITROSTAT) 0.4 MG SL tablet Place 1 tablet (0.4 mg total) under the tongue every 5 (five) minutes x 3 doses as needed for chest pain. 25 tablet 12  . spironolactone (ALDACTONE) 25 MG tablet Take 1 tablet (25 mg total) by mouth daily. 90 tablet 1   No current facility-administered medications for this encounter.     Allergies  Allergen Reactions  . Aspirin Other (See Comments)    "  stomach irritation", GI bleeding, also mentions colon CA      Social History   Socioeconomic History  . Marital status: Married    Spouse name: Not on file  . Number of children: Not on file  . Years of education: Not on file  . Highest education level: Not on file  Occupational History  . Not on file  Social Needs  . Financial resource strain: Not on file  . Food insecurity:    Worry: Not on file    Inability: Not on file  . Transportation needs:    Medical: Not on file     Non-medical: Not on file  Tobacco Use  . Smoking status: Never Smoker  . Smokeless tobacco: Never Used  Substance and Sexual Activity  . Alcohol use: No  . Drug use: No  . Sexual activity: Never  Lifestyle  . Physical activity:    Days per week: Not on file    Minutes per session: Not on file  . Stress: Not on file  Relationships  . Social connections:    Talks on phone: Not on file    Gets together: Not on file    Attends religious service: Not on file    Active member of club or organization: Not on file    Attends meetings of clubs or organizations: Not on file    Relationship status: Not on file  . Intimate partner violence:    Fear of current or ex partner: Not on file    Emotionally abused: Not on file    Physically abused: Not on file    Forced sexual activity: Not on file  Other Topics Concern  . Not on file  Social History Narrative  . Not on file      Family History  Problem Relation Age of Onset  . Colon cancer Mother   . Deep vein thrombosis Father   . Heart disease Brother   . Heart attack Brother     Vitals:   04/18/18 1035  BP: (!) 150/100  Pulse: 63  Resp: 16  SpO2: 97%  Weight: 95.2 kg (209 lb 12.8 oz)   Wt Readings from Last 3 Encounters:  04/18/18 95.2 kg (209 lb 12.8 oz)  02/15/18 94.3 kg (208 lb)  01/25/18 93 kg (205 lb)    PHYSICAL EXAM: General: Well appearing. No resp difficulty. HEENT: Normal Neck: Supple. JVP flat. Carotids 2+ bilat; no bruits. No thyromegaly or nodule noted. Cor: PMI nondisplaced. RRR, No M/G/R noted Lungs: CTAB, normal effort. Abdomen: Soft, non-tender, non-distended, no HSM. No bruits or masses. +BS . Colostomy present on right side. Extremities: No cyanosis, clubbing, or rash. R and LLE no edema.  Neuro: Alert & orientedx3, cranial nerves grossly intact. moves all 4 extremities w/o difficulty. Affect pleasant   ASSESSMENT & PLAN:  1. Chronic systolic HF - due to NICM -> likely PVC-mediated +/- HTN or  OSA. EF  20-25%. Cath 5/18. Non-obstructive CAD - cMRI 7/18 with LVEF 20%, normal RV, and no LGE - echo 11/18 EF 30-35%. No ICD yet as EF improving.  - Echo 11/2017 EF 45% improving with PVC suppression. Now off amiodarone. See discussion below. - Stable NYHA II symptoms.  - Volume status stable off lasix.  - Continue spironolactone 25 daily.  - Increase losartan to 25 mg daily.  Did not tolerate Entresto 24/26 with hypotension. He will monitor BP closely at home and let us know if he is unable to tolerate higher dose of  losartan. - Continue carvedilol 6.25 bid - PVC burden down to 5% on monitor 10/18. Amio stopped by Dr Curt Bears. See discussion below.  - Reinforced fluid restriction to < 2 L daily, sodium restriction to less than 2000 mg daily, and the importance of daily weights.    2. CAD, nonobstructive - Continue ASA. Off crestor with muscle weakness that resolved after stopping.   - No s/s ischemia.  3. OSA - Only wearing CPAP 2 nights/week now. Having to sleep on the couch to help take care of his wife. Encouraged compliance.   4. PVCs - Burden > 10% on amio via Holter monitor 02/18/17.  - Holter 10/18 PVC burden 5%.  - Follows with Dr Curt Bears. Amio stopped in July by Dr Curt Bears. Plans to reassess PVC burden in 3 months and plan for ablation if they have returned. Has follow up scheduled next month.  5. HTN - Elevated today. SBP 130s at home - Agreeable to trying increased losartan 25 mg daily.   BMET today Increase losartan to 25 mg daily Sees Dr Curt Bears in October with plans to reassess PVC burden. Planning for ablation if PVCs have returned. Follow up 3 months  Georgiana Shore, NP  10:38 AM   Greater than 50% of the 25 minute visit was spent in counseling/coordination of care regarding disease state education, salt/fluid restriction, sliding scale diuretics, and medication compliance.

## 2018-04-18 ENCOUNTER — Ambulatory Visit (HOSPITAL_COMMUNITY)
Admission: RE | Admit: 2018-04-18 | Discharge: 2018-04-18 | Disposition: A | Discharge status: Home / Self Care | Payer: Medicare HMO | Source: Ambulatory Visit | Attending: Cardiology | Admitting: Cardiology

## 2018-04-18 VITALS — BP 150/100 | HR 63 | Resp 16 | Wt 209.8 lb

## 2018-04-18 DIAGNOSIS — I5042 Chronic combined systolic (congestive) and diastolic (congestive) heart failure: Secondary | ICD-10-CM

## 2018-04-18 DIAGNOSIS — I5022 Chronic systolic (congestive) heart failure: Secondary | ICD-10-CM | POA: Insufficient documentation

## 2018-04-18 DIAGNOSIS — I493 Ventricular premature depolarization: Secondary | ICD-10-CM | POA: Diagnosis not present

## 2018-04-18 DIAGNOSIS — I130 Hypertensive heart and chronic kidney disease with heart failure and stage 1 through stage 4 chronic kidney disease, or unspecified chronic kidney disease: Secondary | ICD-10-CM | POA: Diagnosis not present

## 2018-04-18 DIAGNOSIS — N183 Chronic kidney disease, stage 3 (moderate): Secondary | ICD-10-CM | POA: Insufficient documentation

## 2018-04-18 DIAGNOSIS — I2510 Atherosclerotic heart disease of native coronary artery without angina pectoris: Secondary | ICD-10-CM | POA: Diagnosis not present

## 2018-04-18 DIAGNOSIS — I10 Essential (primary) hypertension: Secondary | ICD-10-CM

## 2018-04-18 DIAGNOSIS — Z8249 Family history of ischemic heart disease and other diseases of the circulatory system: Secondary | ICD-10-CM | POA: Diagnosis not present

## 2018-04-18 DIAGNOSIS — Z800 Family history of malignant neoplasm of digestive organs: Secondary | ICD-10-CM | POA: Insufficient documentation

## 2018-04-18 DIAGNOSIS — Z79899 Other long term (current) drug therapy: Secondary | ICD-10-CM | POA: Diagnosis not present

## 2018-04-18 DIAGNOSIS — K219 Gastro-esophageal reflux disease without esophagitis: Secondary | ICD-10-CM | POA: Diagnosis not present

## 2018-04-18 DIAGNOSIS — G4733 Obstructive sleep apnea (adult) (pediatric): Secondary | ICD-10-CM

## 2018-04-18 DIAGNOSIS — Z7982 Long term (current) use of aspirin: Secondary | ICD-10-CM | POA: Insufficient documentation

## 2018-04-18 DIAGNOSIS — E785 Hyperlipidemia, unspecified: Secondary | ICD-10-CM | POA: Diagnosis not present

## 2018-04-18 DIAGNOSIS — I428 Other cardiomyopathies: Secondary | ICD-10-CM | POA: Diagnosis not present

## 2018-04-18 DIAGNOSIS — Z933 Colostomy status: Secondary | ICD-10-CM | POA: Insufficient documentation

## 2018-04-18 DIAGNOSIS — Z85038 Personal history of other malignant neoplasm of large intestine: Secondary | ICD-10-CM | POA: Insufficient documentation

## 2018-04-18 DIAGNOSIS — I252 Old myocardial infarction: Secondary | ICD-10-CM | POA: Insufficient documentation

## 2018-04-18 LAB — BASIC METABOLIC PANEL
Anion gap: 9 (ref 5–15)
BUN: 13 mg/dL (ref 8–23)
CO2: 21 mmol/L — ABNORMAL LOW (ref 22–32)
Calcium: 9.2 mg/dL (ref 8.9–10.3)
Chloride: 110 mmol/L (ref 98–111)
Creatinine, Ser: 1.65 mg/dL — ABNORMAL HIGH (ref 0.61–1.24)
GFR calc Af Amer: 48 mL/min — ABNORMAL LOW (ref >60)
GFR, EST NON AFRICAN AMERICAN: 42 mL/min — AB (ref >60)
Glucose, Bld: 93 mg/dL (ref 70–99)
POTASSIUM: 4 mmol/L (ref 3.5–5.1)
SODIUM: 140 mmol/L (ref 135–145)

## 2018-04-18 MED ORDER — LOSARTAN POTASSIUM 25 MG PO TABS: 25.0000 mg | ORAL_TABLET | Freq: Every day | ORAL | 3 refills | Status: DC

## 2018-04-18 NOTE — Patient Instructions (Signed)
INCREASE Losartan (Cozaar) to 25 mg (1 whole tablet) once daily.  Routine lab work today. Will notify you of abnormal results, otherwise no news is good news!  Follow up 3 months.  _____________________________________________________________ David Underwood Code: 4045  Take all medication as prescribed the day of your appointment. Bring all medications with you to your appointment.  Do the following things EVERYDAY: 1) Weigh yourself in the morning before breakfast. Write it down and keep it in a log. 2) Take your medicines as prescribed 3) Eat low salt foods-Limit salt (sodium) to 2000 mg per day.  4) Stay as active as you can everyday 5) Limit all fluids for the day to less than 2 liters

## 2018-05-01 ENCOUNTER — Ambulatory Visit: Payer: Medicare HMO | Admitting: Cardiology

## 2018-05-02 ENCOUNTER — Ambulatory Visit: Payer: Medicare HMO | Admitting: Cardiology

## 2018-05-02 ENCOUNTER — Encounter: Payer: Self-pay | Admitting: Cardiology

## 2018-05-02 VITALS — BP 132/80 | HR 53 | Ht 71.0 in | Wt 211.4 lb

## 2018-05-02 DIAGNOSIS — Z23 Encounter for immunization: Secondary | ICD-10-CM | POA: Diagnosis not present

## 2018-05-02 DIAGNOSIS — G4733 Obstructive sleep apnea (adult) (pediatric): Secondary | ICD-10-CM | POA: Diagnosis not present

## 2018-05-02 DIAGNOSIS — I428 Other cardiomyopathies: Secondary | ICD-10-CM | POA: Diagnosis not present

## 2018-05-02 DIAGNOSIS — I2510 Atherosclerotic heart disease of native coronary artery without angina pectoris: Secondary | ICD-10-CM | POA: Diagnosis not present

## 2018-05-02 DIAGNOSIS — I10 Essential (primary) hypertension: Secondary | ICD-10-CM

## 2018-05-02 DIAGNOSIS — I493 Ventricular premature depolarization: Secondary | ICD-10-CM | POA: Diagnosis not present

## 2018-05-02 DIAGNOSIS — E785 Hyperlipidemia, unspecified: Secondary | ICD-10-CM

## 2018-05-02 NOTE — Progress Notes (Signed)
Electrophysiology Office Note   Date:  05/02/2018   ID:  David, Underwood Jul 31, 1950, MRN [id number]  PCP:  [facility]  Cardiologist:  Radford Pax Primary Electrophysiologist:  Arianny Pun Meredith Leeds, MD    No chief complaint on file.    History of Present Illness: David Underwood is a 67 y.o. male who is being seen today for the evaluation of CHF, PVCs at the request of Center, [facility]. Presenting today for electrophysiology evaluation. He has a history of GERD, colon cancer status post colostomy, hypertension, CKG stage III, recently diagnosed nonischemic cardiomyopathy, nonobstructive coronary disease, PVCs, orthostatic hypotension, and junctional rhythm. Echo 11/2016 showed an EF of 20-25%.   He was initially on both beta-blockers and Entresto but these were stopped due to bradycardia and orthostatic hypotension.  Repeat cardiac monitor after initiation of amiodarone showed an improved  PVC burden at 5%.  An ICD was not implanted as his ejection fraction had improved.  He was started on losartan and low-dose carvedilol which he has been tolerating.  He has since been diagnosed with sleep apnea.  He wears CPAP.  Today, denies symptoms of palpitations, chest pain, shortness of breath, orthopnea, PND, lower extremity edema, claudication, dizziness, presyncope, syncope, bleeding, or neurologic sequela. The patient is tolerating medications without difficulties.  Overall he is feeling well.  He notes no chest pain, shortness of breath, or palpitations.  He is able to do all of his daily activities without restriction.   Past Medical History:  Diagnosis Date  . Back pain   . Chronic combined systolic and diastolic heart failure ([place]) 12/24/2016  . Colon cancer [facility])    s/p colostomy  . GERD (gastroesophageal reflux disease)    with associated esophagitis on PPI  . Hyperlipidemia   . Hypertension   . Junctional rhythm   . Myocardial infarction ([place])   . NICM  (nonischemic cardiomyopathy) ([place])   . Nonobstructive atherosclerosis of coronary artery    a. 12/21/16- Cath: non-obstructive CAD, EF <25%.  . Orthostatic hypotension   . OSA on CPAP 10/26/2017   Moderate with AHI 28/hr now on CPAP at 11cm H2O.    . PVC's (premature ventricular contractions)    Past Surgical History:  Procedure Laterality Date  . COLON SURGERY    . LEFT HEART CATH AND CORONARY ANGIOGRAPHY N/A 12/21/2016   Procedure: Left Heart Cath and Coronary Angiography;  Surgeon: Martinique, Peter M, MD;  Location: [facility];  Service: Cardiovascular;  Laterality: N/A;     Current Outpatient Medications  Medication Sig Dispense Refill  . aspirin 81 MG EC tablet Take 1 tablet (81 mg total) by mouth daily. 30 tablet 11  . co-enzyme Q-10 30 MG capsule Take 30 mg by mouth daily.    Marland Kitchen losartan (COZAAR) 25 MG tablet Take 1 tablet (25 mg total) by mouth daily. 90 tablet 3  . nitroGLYCERIN (NITROSTAT) 0.4 MG SL tablet Place 1 tablet (0.4 mg total) under the tongue every 5 (five) minutes x 3 doses as needed for chest pain. 25 tablet 12  . rosuvastatin (CRESTOR) 20 MG tablet Take 20 mg by mouth at bedtime.  3  . spironolactone (ALDACTONE) 25 MG tablet Take 1 tablet (25 mg total) by mouth daily. 90 tablet 1  . carvedilol (COREG) 6.25 MG tablet Take 1 tablet (6.25 mg total) by mouth 2 (two) times daily with a meal. 180 tablet 1   No current facility-administered medications for this visit.     Allergies:  Aspirin   Social History:  The patient  reports that he has never smoked. He has never used smokeless tobacco. He reports that he does not drink alcohol or use drugs.   Family History:  The patient's family history includes Colon cancer in his mother; Deep vein thrombosis in his father; Heart attack in his brother; Heart disease in his brother.   ROS:  Please see the history of present illness.   Otherwise, review of systems is positive for chest pressure, cough.   All other systems  are reviewed and negative.   PHYSICAL EXAM: VS:  BP 132/80   Pulse (!) 53   Ht 5\' 11"  (1.803 m)   Wt 211 lb 6.4 oz (95.9 kg)   SpO2 96%   BMI 29.48 kg/m  , BMI Body mass index is 29.48 kg/m. GEN: Well nourished, well developed, in no acute distress  HEENT: normal  Neck: no JVD, carotid bruits, or masses Cardiac: RRR; no murmurs, rubs, or gallops,no edema  Respiratory:  clear to auscultation bilaterally, normal work of breathing GI: soft, nontender, nondistended, + BS MS: no deformity or atrophy  Skin: warm and dry Neuro:  Strength and sensation are intact Psych: euthymic mood, full affect  EKG:  EKG is ordered today. Personal review of the ekg ordered shows sinus rhythm, lateral T wave inversions, rate 53   Recent Labs: 06/10/2017: B Natriuretic Peptide 15.7 11/15/2017: TSH 1.650 12/20/2017: ALT 20; Hemoglobin 15.1; Platelets 219 04/18/2018: BUN 13; Creatinine, Ser 1.65; Potassium 4.0; Sodium 140    Lipid Panel     Component Value Date/Time   CHOL 119 02/08/2017 0902   TRIG 61 02/08/2017 0902   HDL 54 02/08/2017 0902   CHOLHDL 2.2 02/08/2017 0902   CHOLHDL 3.5 12/20/2016 0454   VLDL 11 12/20/2016 0454   LDLCALC 53 02/08/2017 0902     Wt Readings from Last 3 Encounters:  05/02/18 211 lb 6.4 oz (95.9 kg)  04/18/18 209 lb 12.8 oz (95.2 kg)  02/15/18 208 lb (94.3 kg)      Other studies Reviewed: Additional studies/ records that were reviewed today include: TTE 12/16/17 Review of the above records today demonstrates:  - Left ventricle: The cavity size was normal. Wall thickness was   increased in a pattern of mild LVH. Systolic function was mildly   to moderately reduced. The estimated ejection fraction was in the   range of 40% to 45%. Diffuse hypokinesis. There was no evidence   of elevated ventricular filling pressure by Doppler parameters. - Aortic valve: There was trivial regurgitation. - Mitral valve: There was mild regurgitation. - Right ventricle: The  cavity size was mildly dilated. Wall   thickness was normal.  Cath 12/21/16  Mid RCA lesion, 30 %stenosed.  RPDA-2 lesion, 50 %stenosed.  RPDA-1 lesion, 50 %stenosed.  Prox LAD to Mid LAD lesion, 30 %stenosed.  Ost 2nd Mrg to 2nd Mrg lesion, 15 %stenosed.  There is severe left ventricular systolic dysfunction.  LV end diastolic pressure is normal.  The left ventricular ejection fraction is less than 25% by visual estimate.   1. Nonobstructive CAD 2. Severe LV dysfunction 3. Normal LVEDP  Holter 02/22/17 - personally reviewed  Normal sinus rhythm and sinus tachycardia. The average heart rate was 72bpm and the heart rate ranged from 45-119bpm.  Frequent PVCs, ventricular couplets, ventricular bigeminy, multifocal ventricular salvos.  PVC load was 10%  CMRI 02/09/17 1. Mildly dilated LV with mild concentric LVH. Diffuse hypokinesis with EF 20%.  2.  Normal RV size and systolic function.  3. No myocardial LGE, so no definitive evidence for prior MI, infiltrative disease, or myocarditis.  Holter 05/16/17 - personally reviewed Minimum HR: 40 BPM at 4:17:37 AM(2) Maximum HR: 128 BPM at 4:21:48 PM Average HR: 68 BPM 5% PVCs <1% APCs Sinus rhythm Zero atrial fibrillation  ASSESSMENT AND PLAN:  1.  Nonischemic cardiomyopathy: On Coreg Aldactone and losartan.  Ejection fraction has improved.  No changes.    2. Obstructive sleep apnea: CPAP compliance encouraged  3. Nonobstructive coronary artery disease: No current chest pain  4. PVCs: He is young and thus amiodarone has been stopped.  His EKG today shows no further PVCs.  I David Underwood get a 48-hour monitor to get a further determination of his PVC burden overall.  If his PVCs are back, he may benefit from ablation.  5.  Hypertension: Well-controlled.  No changes.  6.  Hyperlipidemia: Continue Crestor   Current medicines are reviewed at length with the patient today.   The patient does not have concerns regarding his  medicines.  The following changes were made today: None  Labs/ tests ordered today include:  Orders Placed This Encounter  Procedures  . Flu vaccine HIGH DOSE PF  . Holter monitor - 48 hour  . EKG 12-Lead     Disposition:   FU with Min Tunnell 6 month  Signed, David Underwood Meredith Leeds, MD  05/02/2018 12:15 PM     [place] [address] [phone] (office) [phone] (fax)

## 2018-05-02 NOTE — Patient Instructions (Signed)
Medication Instructions:  Your physician recommends that you continue on your current medications as directed. Please refer to the Current Medication list given to you today.  If you need a refill on your cardiac medications before your next appointment, please call your pharmacy.   Lab work: None ordered  Testing/Procedures: Your physician has recommended that you wear a 48 hour holter monitor. Holter monitors are medical devices that record the heart's electrical activity. Doctors most often use these monitors to diagnose arrhythmias. Arrhythmias are problems with the speed or rhythm of the heartbeat. The monitor is a small, portable device. You can wear one while you do your normal daily activities. This is usually used to diagnose what is causing palpitations/syncope (passing out).   Follow-Up: At [facility], you and your health needs are our priority.  As part of our continuing mission to provide you with exceptional heart care, we have created designated Provider Care Teams.  These Care Teams include your primary Cardiologist (physician) and Advanced Practice Providers (APPs -  Physician Assistants and Nurse Practitioners) who all work together to provide you with the care you need, when you need it. You will need a follow up appointment in 6 months.  Please call our office 2 months in advance to schedule this appointment.  You may see Will Meredith Leeds, MD or one of the following Advanced Practice Providers on your designated Care Team:   Chanetta Marshall, NP . Tommye Standard, PA-C  Thank you for choosing [facility]!!   Trinidad Curet, RN [phone]

## 2018-05-06 IMAGING — MR MR CARD MORPHOLOGY WO/W CM
8 of 9 series · 15 of 16 positions shown · IV contrast (multihance)
Comparison: none

CLINICAL DATA: Cardiomyopathy of uncertain etiology.

EXAM:
CARDIAC MRI
TECHNIQUE: The patient was scanned on a 1.5 Tesla GE magnet. A dedicated
cardiac coil was used. Functional imaging was done using Fiesta
sequences. [DATE], and 4 chamber views were done to assess for RWMA's.
Modified Surbhi rule using a short axis stack was used to
calculate an ejection fraction on a dedicated work station using
Circle software. The patient received 38 cc of Multihance. After 10
minutes inversion recovery sequences were used to assess for
infiltration and scar tissue.
CONTRAST:  38 cc Multihance contrast

[Series 3: bSSFP · sagittal · 8.0mm · 1.48mm/px · 1 of 14 slices shown (1 of 5)]
[im 1/14]
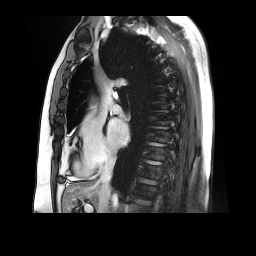

[Series 4: bSSFP · axial · 8.0mm · 1.48mm/px · 1 of 20 slices shown (2 of 5)]
[im 1/20]
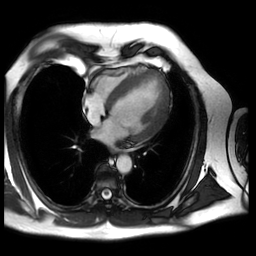

[Series 5: bSSFP · oblique · 8.0mm · 1.68mm/px · 5 of 360 slices shown (3 of 5)]
[im 1/360]
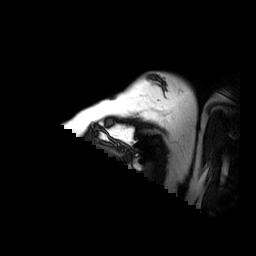
[im 90/360]
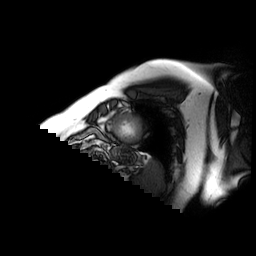
[im 180/360]
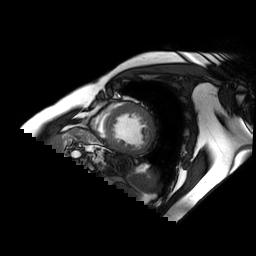
[im 270/360]
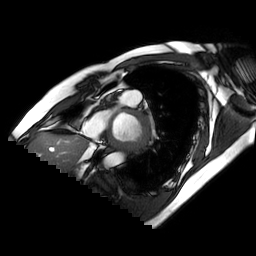
[im 360/360]
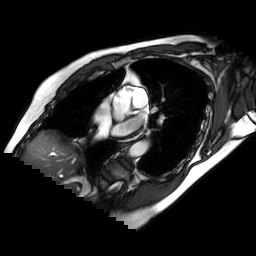

[Series 6: bSSFP · oblique · 8.0mm · 1.64mm/px · 1 of 60 slices shown (4 of 5)]
[im 1/60]
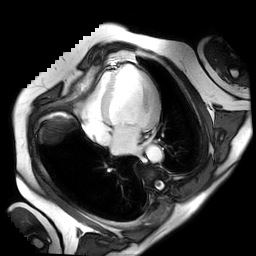

[Series 7: bSSFP · axial · 6.0mm · 0.82mm/px · z∈[-164,-91]mm · 4 of 320 slices shown (5 of 5)]
[im 1/320]
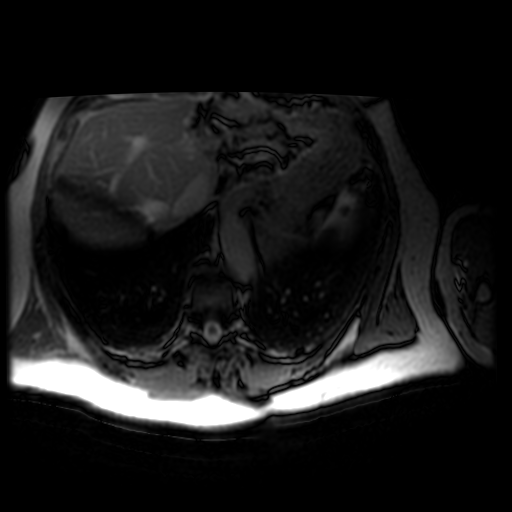
[im 107/320]
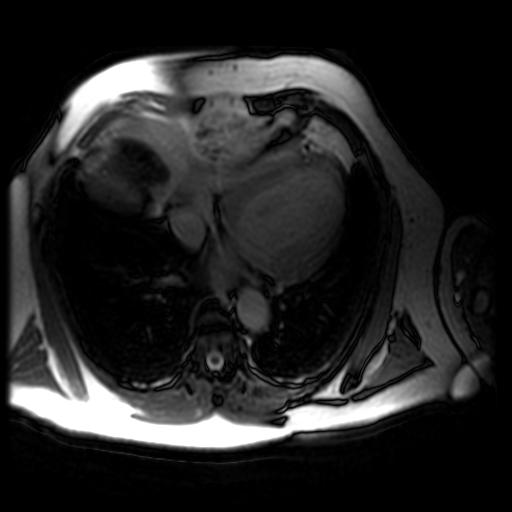
[im 213/320]
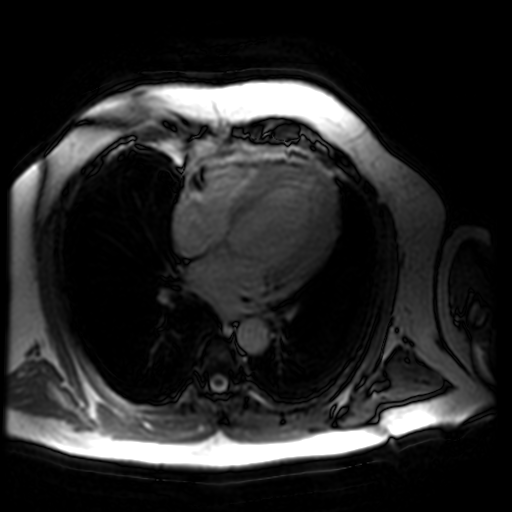
[im 320/320]
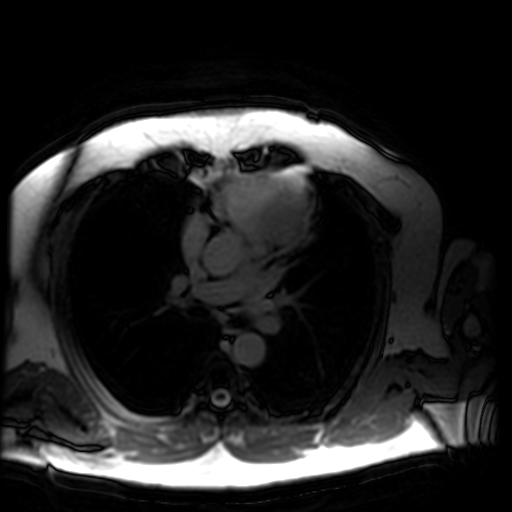

[Series 8: cine ir · oblique · 8.0mm · 1.72mm/px · 1 of 30 slices shown]
[im 1/30]
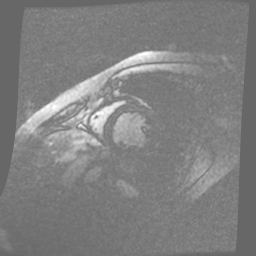

[Series 12: delayed ir prep · oblique · 8.0mm · 1.68mm/px · 1 of 15 slices shown]
[im 1/15]
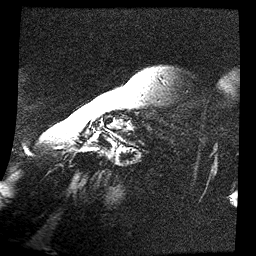

[Series 15: rad delayed ir · oblique · 8.0mm · 1.68mm/px · 1 of 3 slices shown]
[im 1/3]
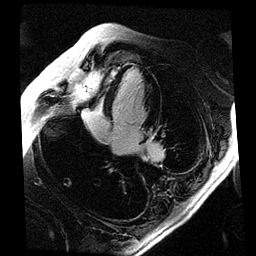

[15 of 16 positions shown; findings below may reference images not displayed]

FINDINGS: Limited images of the lung fields showed no gross abnormalities.

Mildly dilated left ventricle with mild concentric LV hypertrophy.
EF 20% with diffuse hypokinesis. Mild left atrial enlargement.
Normal right ventricular size and systolic function. Normal right
atrial size. Mild mitral regurgitation. Trileaflet aortic valve with
no stenosis, trivial aortic insufficiency.

On delayed enhancement imaging, there was no definitive myocardial
late gadolinium enhancement (LGE).

MEASUREMENTS:
MEASUREMENTS
LVEDV 217 mL

LVSV 43 mL

LVEF 20%
IMPRESSION: 1. Mildly dilated LV with mild concentric LVH. Diffuse hypokinesis
with EF 20%.

2.  Normal RV size and systolic function.

3. No myocardial LGE, so no definitive evidence for prior MI,
infiltrative disease, or myocarditis.

Eippahc Oneurudh

## 2018-05-09 ENCOUNTER — Ambulatory Visit (INDEPENDENT_AMBULATORY_CARE_PROVIDER_SITE_OTHER): Payer: Medicare HMO

## 2018-05-09 DIAGNOSIS — I493 Ventricular premature depolarization: Secondary | ICD-10-CM | POA: Diagnosis not present

## 2018-05-30 ENCOUNTER — Ambulatory Visit: Payer: Medicare HMO | Admitting: Cardiology

## 2018-05-30 ENCOUNTER — Encounter: Payer: Self-pay | Admitting: Cardiology

## 2018-05-30 VITALS — BP 148/82 | HR 80 | Ht 71.0 in | Wt 208.0 lb

## 2018-05-30 DIAGNOSIS — I10 Essential (primary) hypertension: Secondary | ICD-10-CM

## 2018-05-30 DIAGNOSIS — G4733 Obstructive sleep apnea (adult) (pediatric): Secondary | ICD-10-CM | POA: Diagnosis not present

## 2018-05-30 DIAGNOSIS — I493 Ventricular premature depolarization: Secondary | ICD-10-CM

## 2018-05-30 DIAGNOSIS — I2510 Atherosclerotic heart disease of native coronary artery without angina pectoris: Secondary | ICD-10-CM | POA: Diagnosis not present

## 2018-05-30 DIAGNOSIS — I428 Other cardiomyopathies: Secondary | ICD-10-CM

## 2018-05-30 DIAGNOSIS — E785 Hyperlipidemia, unspecified: Secondary | ICD-10-CM

## 2018-05-30 MED ORDER — MEXILETINE HCL 250 MG PO CAPS: 250.0000 mg | ORAL_CAPSULE | Freq: Two times a day (BID) | ORAL | 6 refills | Status: DC

## 2018-05-30 NOTE — Patient Instructions (Signed)
Medication Instructions:  Your physician has recommended you make the following change in your medication:  1. START Mexiletine 250 mg twice a day.  If you need a refill on your cardiac medications before your next appointment, please call your pharmacy.   Lab work: None ordered  Testing/Procedures: None ordered  Follow-Up: At [facility], you and your health needs are our priority.  As part of our continuing mission to provide you with exceptional heart care, we have created designated Provider Care Teams.  These Care Teams include your primary Cardiologist (physician) and Advanced Practice Providers (APPs -  Physician Assistants and Nurse Practitioners) who all work together to provide you with the care you need, when you need it. You will need a follow up appointment in 6 months.  Please call our office 2 months in advance to schedule this appointment.  You may see Will Meredith Leeds, MD or one of the following Advanced Practice Providers on your designated Care Team:   Chanetta Marshall, NP . Tommye Standard, PA-C  Thank you for choosing [facility]!!   Trinidad Curet, RN [phone]  Any Other Special Instructions Will Be Listed Below (If Applicable).  Mexiletine capsules What is this medicine? MEXILETINE (mex [place] e teen) is an antiarrhythmic agent. This medicine is used to treat irregular heart rhythm and can slow rapid heartbeats. It can help your heart to return to and maintain a normal rhythm. Because of the side effects caused by this medicine, it is usually used for heartbeat problems that may be life-threatening. This medicine may be used for other purposes; ask your health care provider or pharmacist if you have questions. COMMON BRAND NAME(S): Mexitil What should I tell my health care provider before I take this medicine? They need to know if you have any of these conditions: -liver disease -other heart problems -previous heart attack -an unusual or allergic reaction  to mexiletine, other medicines, foods, dyes, or preservatives -pregnant or trying to get pregnant -breast-feeding How should I use this medicine? Take this medicine by mouth with a glass of water. Follow the directions on the prescription label. It is recommended that you take this medicine with food or an antacid. Take your doses at regular intervals. Do not take your medicine more often than directed. Do not stop taking except on the advice of your doctor or health care professional. Talk to your pediatrician regarding the use of this medicine in children. Special care may be needed. Overdosage: If you think you have taken too much of this medicine contact a poison control center or emergency room at once. NOTE: This medicine is only for you. Do not share this medicine with others. What if I miss a dose? If you miss a dose, take it as soon as you can. If it is almost time for your next dose, take only that dose. Do not take double or extra doses. What may interact with this medicine? Do not take this medicine with any of the following medications: -dofetilide This medicine may also interact with the following medications: -caffeine -cimetidine -medicines for depression, anxiety, or psychotic disturbances -medicines to control heart rhythm -phenobarbital -phenytoin -rifampin -theophylline This list may not describe all possible interactions. Give your health care provider a list of all the medicines, herbs, non-prescription drugs, or dietary supplements you use. Also tell them if you smoke, drink alcohol, or use illegal drugs. Some items may interact with your medicine. What should I watch for while using this medicine? Your condition will  be monitored closely when you first begin therapy. Often, this drug is first started in a hospital or other monitored health care setting. Once you are on maintenance therapy, visit your doctor or health care professional for regular checks on your  progress. Because your condition and use of this medicine carry some risk, it is a good idea to carry an identification card, necklace or bracelet with details of your condition, medications, and doctor or health care professional. Dennis Bast may get drowsy or dizzy. Do not drive, use machinery, or do anything that needs mental alertness until you know how this medicine affects you. Do not stand or sit up quickly, especially if you are an older patient. This reduces the risk of dizzy or fainting spells. Alcohol can make you more dizzy, increase flushing and rapid heartbeats. Avoid alcoholic drinks. What side effects may I notice from receiving this medicine? Side effects that you should report to your doctor or health care professional as soon as possible: -allergic reactions like skin rash, itching or hives, swelling of the face, lips, or tongue -breathing problems -chest pain, continued irregular heartbeats -redness, blistering, peeling or loosening of the skin, including inside the mouth -seizures -skin rash -trembling, shaking -unusual bleeding or bruising -unusually weak or tired Side effects that usually do not require medical attention (report to your doctor or health care professional if they continue or are bothersome): -blurred vision -difficulty walking -heartburn -nausea, vomiting -nervousness -numbness, or tingling in the fingers or toes This list may not describe all possible side effects. Call your doctor for medical advice about side effects. You may report side effects to FDA at 1-800-FDA-1088. Where should I keep my medicine? Keep out of reach of children. Store at room temperature between 15 and 30 degrees C (59 and 86 degrees F). Throw away any unused medicine after the expiration date. NOTE: This sheet is a summary. It may not cover all possible information. If you have questions about this medicine, talk to your doctor, pharmacist, or health care provider.  2018 [employer] (2008-01-29 13:59:49)

## 2018-05-30 NOTE — Progress Notes (Signed)
Electrophysiology Office Note   Date:  05/30/2018   ID:  David, Underwood [birth year], MRN [id number]  PCP:  [facility]  Cardiologist:  Radford Pax Primary Electrophysiologist:  Kyion Gautier Meredith Leeds, MD    No chief complaint on file.    History of Present Illness: David Underwood is a 67 y.o. male who is being seen today for the evaluation of CHF, PVCs at the request of Center, [facility]. Presenting today for electrophysiology evaluation. He has a history of GERD, colon cancer status post colostomy, hypertension, CKG stage III, recently diagnosed nonischemic cardiomyopathy, nonobstructive coronary disease, PVCs, orthostatic hypotension, and junctional rhythm. Echo 11/2016 showed an EF of 20-25%.   He was initially on both beta-blockers and Entresto but these were stopped due to bradycardia and orthostatic hypotension.  Repeat cardiac monitor after initiation of amiodarone showed an improved  PVC burden at 5%.  An ICD was not implanted as his ejection fraction had improved.  He was started on losartan and low-dose carvedilol which he has been tolerating.  He has since been diagnosed with sleep apnea.  He wears CPAP.  Repeat echo showed an EF of 40 to 45%.  He was taken off of amiodarone and his PVC burden went back up to 15%.  Today, denies symptoms of palpitations, chest pain, shortness of breath, orthopnea, PND, lower extremity edema, claudication, dizziness, presyncope, syncope, bleeding, or neurologic sequela. The patient is tolerating medications without difficulties.  He is feeling well today.  He has no chest pain or shortness of breath.  He is unaware of his PVC burden.   Past Medical History:  Diagnosis Date  . Back pain   . Chronic combined systolic and diastolic heart failure ([place]) 12/24/2016  . Colon cancer [facility])    s/p colostomy  . GERD (gastroesophageal reflux disease)    with associated esophagitis on PPI  . Hyperlipidemia   . Hypertension   .  Junctional rhythm   . Myocardial infarction ([place])   . NICM (nonischemic cardiomyopathy) ([place])   . Nonobstructive atherosclerosis of coronary artery    a. 12/21/16- Cath: non-obstructive CAD, EF <25%.  . Orthostatic hypotension   . OSA on CPAP 10/26/2017   Moderate with AHI 28/hr now on CPAP at 11cm H2O.    . PVC's (premature ventricular contractions)    Past Surgical History:  Procedure Laterality Date  . COLON SURGERY    . LEFT HEART CATH AND CORONARY ANGIOGRAPHY N/A 12/21/2016   Procedure: Left Heart Cath and Coronary Angiography;  Surgeon: Martinique, Peter M, MD;  Location: [facility];  Service: Cardiovascular;  Laterality: N/A;     Current Outpatient Medications  Medication Sig Dispense Refill  . aspirin 81 MG EC tablet Take 1 tablet (81 mg total) by mouth daily. 30 tablet 11  . co-enzyme Q-10 30 MG capsule Take 30 mg by mouth daily.    Marland Kitchen losartan (COZAAR) 25 MG tablet Take 1 tablet (25 mg total) by mouth daily. 90 tablet 3  . nitroGLYCERIN (NITROSTAT) 0.4 MG SL tablet Place 1 tablet (0.4 mg total) under the tongue every 5 (five) minutes x 3 doses as needed for chest pain. 25 tablet 12  . rosuvastatin (CRESTOR) 20 MG tablet Take 20 mg by mouth at bedtime.  3  . spironolactone (ALDACTONE) 25 MG tablet Take 1 tablet (25 mg total) by mouth daily. 90 tablet 1  . carvedilol (COREG) 6.25 MG tablet Take 1 tablet (6.25 mg total) by mouth 2 (two) times daily with  a meal. 180 tablet 1  . mexiletine (MEXITIL) 250 MG capsule Take 1 capsule (250 mg total) by mouth 2 (two) times daily. 60 capsule 6   No current facility-administered medications for this visit.     Allergies:   Aspirin   Social History:  The patient  reports that he has never smoked. He has never used smokeless tobacco. He reports that he does not drink alcohol or use drugs.   Family History:  The patient's family history includes Colon cancer in his mother; Deep vein thrombosis in his father; Heart attack in his brother;  Heart disease in his brother.   ROS:  Please see the history of present illness.   Otherwise, review of systems is positive for visual changes, snoring.   All other systems are reviewed and negative.   PHYSICAL EXAM: VS:  BP (!) 148/82   Pulse 80   Ht 5\' 11"  (1.803 m)   Wt 208 lb (94.3 kg)   SpO2 97%   BMI 29.01 kg/m  , BMI Body mass index is 29.01 kg/m. GEN: Well nourished, well developed, in no acute distress  HEENT: normal  Neck: no JVD, carotid bruits, or masses Cardiac: RRR; no murmurs, rubs, or gallops,no edema  Respiratory:  clear to auscultation bilaterally, normal work of breathing GI: soft, nontender, nondistended, + BS MS: no deformity or atrophy  Skin: warm and dry Neuro:  Strength and sensation are intact Psych: euthymic mood, full affect  EKG:  EKG is not ordered today. Personal review of the ekg ordered 05/02/18 shows this rhythm, rate 53, lateral T wave inversions   Recent Labs: 06/10/2017: B Natriuretic Peptide 15.7 11/15/2017: TSH 1.650 12/20/2017: ALT 20; Hemoglobin 15.1; Platelets 219 04/18/2018: BUN 13; Creatinine, Ser 1.65; Potassium 4.0; Sodium 140    Lipid Panel     Component Value Date/Time   CHOL 119 02/08/2017 0902   TRIG 61 02/08/2017 0902   HDL 54 02/08/2017 0902   CHOLHDL 2.2 02/08/2017 0902   CHOLHDL 3.5 12/20/2016 0454   VLDL 11 12/20/2016 0454   LDLCALC 53 02/08/2017 0902     Wt Readings from Last 3 Encounters:  05/30/18 208 lb (94.3 kg)  05/02/18 211 lb 6.4 oz (95.9 kg)  04/18/18 209 lb 12.8 oz (95.2 kg)      Other studies Reviewed: Additional studies/ records that were reviewed today include: TTE 12/16/17 Review of the above records today demonstrates:  - Left ventricle: The cavity size was normal. Wall thickness was   increased in a pattern of mild LVH. Systolic function was mildly   to moderately reduced. The estimated ejection fraction was in the   range of 40% to 45%. Diffuse hypokinesis. There was no evidence   of  elevated ventricular filling pressure by Doppler parameters. - Aortic valve: There was trivial regurgitation. - Mitral valve: There was mild regurgitation. - Right ventricle: The cavity size was mildly dilated. Wall   thickness was normal.  Cath 12/21/16  Mid RCA lesion, 30 %stenosed.  RPDA-2 lesion, 50 %stenosed.  RPDA-1 lesion, 50 %stenosed.  Prox LAD to Mid LAD lesion, 30 %stenosed.  Ost 2nd Mrg to 2nd Mrg lesion, 15 %stenosed.  There is severe left ventricular systolic dysfunction.  LV end diastolic pressure is normal.  The left ventricular ejection fraction is less than 25% by visual estimate.   1. Nonobstructive CAD 2. Severe LV dysfunction 3. Normal LVEDP  Holter 02/22/17 - personally reviewed  Normal sinus rhythm and sinus tachycardia. The average heart rate  was 72bpm and the heart rate ranged from 45-119bpm.  Frequent PVCs, ventricular couplets, ventricular bigeminy, multifocal ventricular salvos.  PVC load was 10%  CMRI 02/09/17 1. Mildly dilated LV with mild concentric LVH. Diffuse hypokinesis with EF 20%.  2.  Normal RV size and systolic function.  3. No myocardial LGE, so no definitive evidence for prior MI, infiltrative disease, or myocarditis.  Holter 05/18/2018-personally reviewed Minimum HR: 45 BPM at 4:52:38 AM(2) Maximum HR: 116 BPM at 2:44:49 PM(2) Average HR: 73 BPM 32072 PVCs (15%) Rate PACs Sinus rhythm with sinus tachycardia Longest ventricular run 3 beats  ASSESSMENT AND PLAN:  1.  Nonischemic cardiomyopathy: Ejection fraction 40 to 45%.  Is on optimal medical therapy.  No changes.  2. Obstructive sleep apnea: CPAP compliance encouraged  3. Nonobstructive coronary artery disease: No current chest pain  4. PVCs: He is young and thus amiodarone has been stopped.  I do not feel that this is the best medication for him.  I Kristiane Morsch thus start him on mexiletine.  He does have coronary disease and thus flecainide or propafenone would be a  less ideal option.  I Desiderio Dolata see him back in 6 months and Aroldo Galli potentially get a 24-hour monitor to further evaluate his PVCs.  He does have multifocal PVCs on his most recent monitor which would make ablation potentially difficult.  5.  Hypertension: Elevated today but has been more normal in the past.  No changes.  6.  Hyperlipidemia: Continue Crestor   Current medicines are reviewed at length with the patient today.   The patient does not have concerns regarding his medicines.  The following changes were made today: None  Labs/ tests ordered today include:  No orders of the defined types were placed in this encounter.    Disposition:   FU with Morna Flud 6 month  Signed, Nicolasa Milbrath Meredith Leeds, MD  05/30/2018 4:46 PM     [place] [address] [phone] (office) [phone] (fax)

## 2018-06-02 ENCOUNTER — Telehealth: Payer: Self-pay | Admitting: Cardiology

## 2018-06-02 NOTE — Telephone Encounter (Signed)
Pt reports that he cannot afford his Mexiletine.  States it will cost $ 168.41/mo. Pt aware I will forward this to see if there is any help with cost or cost reduction. Pt aware Jeani Hawking will contact him next week to discuss further.

## 2018-06-02 NOTE — Telephone Encounter (Signed)
  Pt c/o medication issue:  1. Name of Medication: mexiletine (MEXITIL) 250 MG capsule  2. How are you currently taking this medication (dosage and times per day)? Take 1 capsule (250 mg total) by mouth 2 (two) times daily.  3. Are you having a reaction (difficulty breathing--STAT)?  No  4. What is your medication issue? This is a new medication for patient and he cannot afford this medication. He also doesn't think he can take pills that large. He did not get his script filled.

## 2018-06-05 NOTE — Telephone Encounter (Signed)
Spoke with Media planner at [employer].Marland KitchenMarland KitchenMexiletine was a Tier 4 but I done a Tier exception and they brought it down to a Tier 1. It is approved as a Tier 1 07/25/2017-07/25/2018. I will notify the pharmacy and the Pt.

## 2018-06-09 ENCOUNTER — Telehealth: Payer: Self-pay | Admitting: Cardiology

## 2018-06-09 NOTE — Telephone Encounter (Signed)
Pt c/o medication issue:  1. Name of Medication: Mexiletine  2. How are you currently taking this medication (dosage and times per day)? 2 times a day  3. Are you having a reaction (difficulty breathing--STAT)? no  4. What is your medication issue? Urinating a lot and at night feels like heart burn

## 2018-06-09 NOTE — Telephone Encounter (Addendum)
Pt informed that urinary frequency is not a SE of this medication.  Advised pt to contact his renal doctor to discuss further. Advised pt to take medication with food to reduce GI upset.  Pt reports that he has been taking it w/ food.  Informed pt I would forward to [facility] for advisement. Pt aware I will call him once advised upon and that it may be next week before return call.  Pt agreeable to plan.   (informed pt he will also need a 24 hour holter prior to his 29mo f/u w/ [facility] next year)

## 2018-06-19 ENCOUNTER — Other Ambulatory Visit: Payer: Self-pay

## 2018-06-19 ENCOUNTER — Emergency Department (HOSPITAL_BASED_OUTPATIENT_CLINIC_OR_DEPARTMENT_OTHER)
Admission: EM | Admit: 2018-06-19 | Discharge: 2018-06-19 | Disposition: A | Discharge status: Home / Self Care | Payer: Medicare HMO | Attending: Emergency Medicine | Admitting: Emergency Medicine

## 2018-06-19 ENCOUNTER — Encounter (HOSPITAL_BASED_OUTPATIENT_CLINIC_OR_DEPARTMENT_OTHER): Payer: Self-pay | Admitting: *Deleted

## 2018-06-19 ENCOUNTER — Emergency Department (HOSPITAL_BASED_OUTPATIENT_CLINIC_OR_DEPARTMENT_OTHER): Payer: Medicare HMO

## 2018-06-19 DIAGNOSIS — I110 Hypertensive heart disease with heart failure: Secondary | ICD-10-CM | POA: Diagnosis not present

## 2018-06-19 DIAGNOSIS — K7689 Other specified diseases of liver: Secondary | ICD-10-CM | POA: Insufficient documentation

## 2018-06-19 DIAGNOSIS — I10 Essential (primary) hypertension: Secondary | ICD-10-CM

## 2018-06-19 DIAGNOSIS — Z7982 Long term (current) use of aspirin: Secondary | ICD-10-CM | POA: Diagnosis not present

## 2018-06-19 DIAGNOSIS — R1084 Generalized abdominal pain: Secondary | ICD-10-CM | POA: Diagnosis present

## 2018-06-19 DIAGNOSIS — Z79899 Other long term (current) drug therapy: Secondary | ICD-10-CM | POA: Insufficient documentation

## 2018-06-19 DIAGNOSIS — I5042 Chronic combined systolic (congestive) and diastolic (congestive) heart failure: Secondary | ICD-10-CM | POA: Diagnosis not present

## 2018-06-19 DIAGNOSIS — N200 Calculus of kidney: Secondary | ICD-10-CM | POA: Insufficient documentation

## 2018-06-19 DIAGNOSIS — K769 Liver disease, unspecified: Secondary | ICD-10-CM

## 2018-06-19 LAB — CBC WITH DIFFERENTIAL/PLATELET
Abs Immature Granulocytes: 0.02 10*3/uL (ref 0.00–0.07)
BASOS PCT: 1 %
Basophils Absolute: 0.1 10*3/uL (ref 0.0–0.1)
EOS PCT: 2 %
Eosinophils Absolute: 0.2 10*3/uL (ref 0.0–0.5)
HCT: 46.6 % (ref 39.0–52.0)
HEMOGLOBIN: 14.9 g/dL (ref 13.0–17.0)
Immature Granulocytes: 0 %
LYMPHS PCT: 18 %
Lymphs Abs: 1.6 10*3/uL (ref 0.7–4.0)
MCH: 30.1 pg (ref 26.0–34.0)
MCHC: 32 g/dL (ref 30.0–36.0)
MCV: 94.1 fL (ref 80.0–100.0)
Monocytes Absolute: 0.6 10*3/uL (ref 0.1–1.0)
Monocytes Relative: 7 %
NEUTROS ABS: 6.4 10*3/uL (ref 1.7–7.7)
NRBC: 0 % (ref 0.0–0.2)
Neutrophils Relative %: 72 %
PLATELETS: 191 10*3/uL (ref 150–400)
RBC: 4.95 MIL/uL (ref 4.22–5.81)
RDW: 12.1 % (ref 11.5–15.5)
WBC: 8.9 10*3/uL (ref 4.0–10.5)

## 2018-06-19 LAB — URINALYSIS, ROUTINE W REFLEX MICROSCOPIC
Bilirubin Urine: NEGATIVE
GLUCOSE, UA: NEGATIVE mg/dL
Ketones, ur: NEGATIVE mg/dL
Leukocytes, UA: NEGATIVE
Nitrite: NEGATIVE
PROTEIN: NEGATIVE mg/dL
Specific Gravity, Urine: >1.03 — ABNORMAL HIGH (ref 1.005–1.030)
pH: 5 (ref 5.0–8.0)

## 2018-06-19 LAB — COMPREHENSIVE METABOLIC PANEL
ALT: 22 U/L (ref 0–44)
AST: 23 U/L (ref 15–41)
Albumin: 4.4 g/dL (ref 3.5–5.0)
Alkaline Phosphatase: 48 U/L (ref 38–126)
Anion gap: 10 (ref 5–15)
BUN: 18 mg/dL (ref 8–23)
CHLORIDE: 107 mmol/L (ref 98–111)
CO2: 22 mmol/L (ref 22–32)
CREATININE: 1.62 mg/dL — AB (ref 0.61–1.24)
Calcium: 9.2 mg/dL (ref 8.9–10.3)
GFR, EST AFRICAN AMERICAN: 49 mL/min — AB (ref >60)
GFR, EST NON AFRICAN AMERICAN: 43 mL/min — AB (ref >60)
Glucose, Bld: 103 mg/dL — ABNORMAL HIGH (ref 70–99)
POTASSIUM: 3.8 mmol/L (ref 3.5–5.1)
Sodium: 139 mmol/L (ref 135–145)
Total Bilirubin: 0.9 mg/dL (ref 0.3–1.2)
Total Protein: 8 g/dL (ref 6.5–8.1)

## 2018-06-19 LAB — URINALYSIS, MICROSCOPIC (REFLEX)

## 2018-06-19 MED ORDER — SODIUM CHLORIDE 0.9 % IV BOLUS
500.0000 mL | Freq: Once | INTRAVENOUS | Status: AC
  Administered 2018-06-19: 500 mL via INTRAVENOUS

## 2018-06-19 MED ORDER — IOPAMIDOL (ISOVUE-300) INJECTION 61%
100.0000 mL | Freq: Once | INTRAVENOUS | Status: AC | PRN
  Administered 2018-06-19: 100 mL via INTRAVENOUS

## 2018-06-19 MED ORDER — HYDROCODONE-ACETAMINOPHEN 5-325 MG PO TABS: 1.0000 | ORAL_TABLET | Freq: Four times a day (QID) | ORAL | 0 refills | Status: DC | PRN

## 2018-06-19 MED ORDER — MORPHINE SULFATE (PF) 4 MG/ML IV SOLN
4.0000 mg | Freq: Once | INTRAVENOUS | Status: AC
  Administered 2018-06-19: 4 mg via INTRAVENOUS
  Filled 2018-06-19: qty 1

## 2018-06-19 NOTE — ED Provider Notes (Signed)
[facility] EMERGENCY DEPARTMENT Provider Note   CSN: [id number] Arrival date & time: 06/19/18  1538     History   Chief Complaint Chief Complaint  Patient presents with  . Flank Pain    HPI David Underwood is a 67 y.o. male.  HPI  Patient is a 67 year old male with a history of colon cancer status post partial colectomy, nonischemic cardiomyopathy with heart failure with reduced ejection fraction last EF 40 to 45%,, myocardial infarction, and GERD presenting for left flank pain.  Patient reports it began suddenly 1 hour prior to arrival.  He describes it as constant nature and radiating down into his left lower quadrant.  Patient reports he vomited once nonbilious and nonbloody.  Patient denies any dysuria, urgency, frequency, hematuria, diarrhea, or constipation.  Denies any fever or chills.  Patient reports he has a history of a kidney stone in 2019, however this feels more constant and severe.  No remedies prior to arrival for symptoms.  Abdominal surgical history significant for right hemicolectomy.  Patient is also followed by urology for known prostatic enlargement, and is scheduled to have an MRI of his pelvis in 2 days.  Past Medical History:  Diagnosis Date  . Back pain   . Chronic combined systolic and diastolic heart failure ([place]) 12/24/2016  . Colon cancer [facility])    s/p colostomy  . GERD (gastroesophageal reflux disease)    with associated esophagitis on PPI  . Hyperlipidemia   . Hypertension   . Junctional rhythm   . Myocardial infarction ([place])   . NICM (nonischemic cardiomyopathy) ([place])   . Nonobstructive atherosclerosis of coronary artery    a. 12/21/16- Cath: non-obstructive CAD, EF <25%.  . Orthostatic hypotension   . OSA on CPAP 10/26/2017   Moderate with AHI 28/hr now on CPAP at 11cm H2O.    . PVC's (premature ventricular contractions)     Patient Active Problem List   Diagnosis Date Noted  . OSA on CPAP 10/26/2017  . PVC's (premature  ventricular contractions) 12/27/2016  . Benign essential HTN 12/27/2016  . Chronic combined systolic and diastolic heart failure ([place]) 12/24/2016  . NICM (nonischemic cardiomyopathy) ([place]) 12/24/2016  . Syncope and collapse 12/23/2016  . Coronary artery disease, non-occlusive with cath 12/21/16 12/22/2016  . Pure hypercholesterolemia     Past Surgical History:  Procedure Laterality Date  . COLON SURGERY    . LEFT HEART CATH AND CORONARY ANGIOGRAPHY N/A 12/21/2016   Procedure: Left Heart Cath and Coronary Angiography;  Surgeon: Martinique, Peter M, MD;  Location: [facility];  Service: Cardiovascular;  Laterality: N/A;        Home Medications    Prior to Admission medications   Medication Sig Start Date End Date Taking? Authorizing Provider  aspirin 81 MG EC tablet Take 1 tablet (81 mg total) by mouth daily. 12/23/16   Delos Haring, PA-C  carvedilol (COREG) 6.25 MG tablet Take 1 tablet (6.25 mg total) by mouth 2 (two) times daily with a meal. 01/25/18 04/25/18  Camnitz, Ocie Doyne, MD  co-enzyme Q-10 30 MG capsule Take 30 mg by mouth daily.    [provider]  losartan (COZAAR) 25 MG tablet Take 1 tablet (25 mg total) by mouth daily. 04/18/18 07/17/18  Georgiana Shore, NP  mexiletine (MEXITIL) 250 MG capsule Take 1 capsule (250 mg total) by mouth 2 (two) times daily. 05/30/18   Camnitz, Ocie Doyne, MD  nitroGLYCERIN (NITROSTAT) 0.4 MG SL tablet Place 1 tablet (0.4 mg  total) under the tongue every 5 (five) minutes x 3 doses as needed for chest pain. 12/22/16   Delos Haring, PA-C  rosuvastatin (CRESTOR) 20 MG tablet Take 20 mg by mouth at bedtime. 04/08/18   [provider]  spironolactone (ALDACTONE) 25 MG tablet Take 1 tablet (25 mg total) by mouth daily. 01/25/18   Camnitz, Ocie Doyne, MD    Family History Family History  Problem Relation Age of Onset  . Colon cancer Mother   . Deep vein thrombosis Father   . Heart disease Brother   . Heart attack Brother      Social History Social History   Tobacco Use  . Smoking status: Never Smoker  . Smokeless tobacco: Never Used  Substance Use Topics  . Alcohol use: No  . Drug use: No     Allergies   Aspirin   Review of Systems Review of Systems  Constitutional: Negative for chills and fever.  HENT: Negative for congestion and sore throat.   Eyes: Negative for visual disturbance.  Respiratory: Negative for cough, chest tightness and shortness of breath.   Cardiovascular: Negative for chest pain and leg swelling.  Gastrointestinal: Positive for abdominal pain, nausea and vomiting. Negative for diarrhea.  Genitourinary: Positive for flank pain. Negative for dysuria.  Musculoskeletal: Negative for back pain and myalgias.  Skin: Negative for rash.  Neurological: Negative for dizziness, syncope, light-headedness and headaches.     Physical Exam Updated Vital Signs BP (!) 169/105   Pulse 70   Temp 97.9 F (36.6 C) (Oral)   Resp 15   Ht 5\' 11"  (1.803 m)   Wt 102.5 kg   SpO2 98%   BMI 31.52 kg/m   Physical Exam  Constitutional: He appears well-developed and well-nourished. No distress.  HENT:  Head: Normocephalic and atraumatic.  Mouth/Throat: Oropharynx is clear and moist.  Eyes: Pupils are equal, round, and reactive to light. Conjunctivae and EOM are normal.  Neck: Normal range of motion. Neck supple.  Cardiovascular: Normal rate, regular rhythm, S1 normal and S2 normal.  No murmur heard. Pulses:      Radial pulses are 2+ on the right side, and 2+ on the left side.       Dorsalis pedis pulses are 2+ on the right side, and 2+ on the left side.  Pulmonary/Chest: Effort normal and breath sounds normal. He has no wheezes. He has no rales.  Abdominal: Soft. He exhibits no distension. There is tenderness. There is no guarding.  Left lower quadrant tenderness to palpation without guarding or rebound. Positive CVA tenderness on left.  Musculoskeletal: Normal range of motion. He  exhibits no edema or deformity.  Neurological: He is alert.  Cranial nerves grossly intact. Patient moves extremities symmetrically and with good coordination.  Skin: Skin is warm and dry. No rash noted. No erythema.  Psychiatric: He has a normal mood and affect. His behavior is normal. Judgment and thought content normal.  Nursing note and vitals reviewed.    ED Treatments / Results  Labs (all labs ordered are listed, but only abnormal results are displayed) Labs Reviewed  URINALYSIS, ROUTINE W REFLEX MICROSCOPIC - Abnormal; Notable for the following components:      Result Value   Specific Gravity, Urine >1.030 (*)    Hgb urine dipstick MODERATE (*)    All other components within normal limits  COMPREHENSIVE METABOLIC PANEL - Abnormal; Notable for the following components:   Glucose, Bld 103 (*)    Creatinine, Ser 1.62 (*)  GFR calc non Af Amer 43 (*)    GFR calc Af Amer 49 (*)    All other components within normal limits  URINALYSIS, MICROSCOPIC (REFLEX) - Abnormal; Notable for the following components:   Bacteria, UA RARE (*)    All other components within normal limits  CBC WITH DIFFERENTIAL/PLATELET    EKG EKG Interpretation  Date/Time:  Monday June 19 2018 16:54:42 EST Ventricular Rate:  70 PR Interval:    QRS Duration: 116 QT Interval:  490 QTC Calculation: 529 R Axis:   50 Text Interpretation:  Sinus rhythm Nonspecific intraventricular conduction delay Anteroseptal infarct, old Nonspecific T abnormalities, lateral leads Confirmed by Gerlene Fee [phone]) on 06/19/2018 4:58:24 PM Also confirmed by Gerlene Fee [phone]), editor Philomena Doheny [phone])  on 06/19/2018 5:10:21 PM   Radiology Ct Abdomen Pelvis W Contrast  Result Date: 06/19/2018 CLINICAL DATA:  Left flank pain starting this afternoon. EXAM: CT ABDOMEN AND PELVIS WITH CONTRAST TECHNIQUE: Multidetector CT imaging of the abdomen and pelvis was performed using the standard protocol following bolus  administration of intravenous contrast. CONTRAST:  169mL ISOVUE-300 IOPAMIDOL (ISOVUE-300) INJECTION 61% COMPARISON:  12/20/2017 and radiograph from 05/25/2018 FINDINGS: Lower chest: Stable scarring in both lower lobes. Hepatobiliary: As on prior exam there is diminished volume in the lateral segment left hepatic lobe associated somewhat serpentine hypodensity favoring associated biliary dilatation. Multiple rim calcified gallstones are present in the gallbladder. Pancreas: Unremarkable Spleen: Unremarkable Adrenals/Urinary Tract: Left kidney lower pole 6 mm nonobstructive renal calculus. Borderline left hydronephrosis with proximal hydroureter and proximal periureteral stranding on the left. I do not see a well-defined ureteral stone, and there no delay in excretion of contrast. The right kidney appears normal. No bladder calculus is currently seen. The adrenal glands appear normal. Stomach/Bowel: Prior colectomy.  Right enterostomy. Vascular/Lymphatic: Aortoiliac atherosclerotic vascular disease. Reproductive: The prostate gland measures 4.9 by 4.6 by 4.7 cm (volume = 55 cm^3) and has mildly indistinct margins. There is also presacral edema. Some of this appearance be related to prior radiation therapy. There is some calcification of the distal vas deferens. Other: 122 No supplemental non-categorized findings. Musculoskeletal: Bridging spurring of the left sacroiliac joint. Lumbar spondylosis and degenerative disc disease causing foraminal impingement at all levels between L2 and S1. IMPRESSION: 1. Borderline left hydronephrosis with mild proximal left hydroureter and periureteral stranding which regard resolved somewhere around the iliac vessel cross over. I do not see a definite left ureteral stone, although there is a 6 mm left kidney lower pole nonobstructive renal calculus. Possibility of a recently passed calculus or some other cause for ureteral inflammation is raised. 2. Diminished volume of the lateral  segment left hepatic lobe with associated potential biliary dilatation limited to this segment. A similar appearance of biliary dilatation and lobar atrophy can be associated with cholangiocarcinoma, and follow up hepatic protocol MRI of the liver with and without contrast is recommended for further characterization. 3. Multiple rim calcified gallstones in the gallbladder. 4.  Aortic Atherosclerosis (ICD10-I70.0). 5. Mild prostatomegaly. 6. Prior colectomy.  Right abdominal enterostomy. 7. Mild multilevel foraminal impingement in the lumbar spine. Electronically Signed   By: Van Clines M.D.   On: 06/19/2018 19:05    Procedures Procedures (including critical care time)  Medications Ordered in ED Medications  sodium chloride 0.9 % bolus 500 mL (500 mLs Intravenous New Bag/Given 06/19/18 1939)  morphine 4 MG/ML injection 4 mg (4 mg Intravenous Given 06/19/18 1650)  iopamidol (ISOVUE-300) 61 % injection 100 mL (100 mLs Intravenous Contrast  Given 06/19/18 1833)     Initial Impression / Assessment and Plan / ED Course  I have reviewed the triage vital signs and the nursing notes.  Pertinent labs & imaging results that were available during my care of the patient were reviewed by me and considered in my medical decision making (see chart for details).  Clinical Course as of Jun 19 2104  Mon Jun 19, 2018  2101 Improved from baseline.   Creatinine(!): 1.62 [AM]    Clinical Course User Index [AM] Albesa Seen, PA-C   Patient is nontoxic-appearing, afebrile, and has nonsurgical abdomen.  He does appear uncomfortable, preferring to sit upright and change positions.  After diagnosis includes nephrolithiasis, diverticulitis, pyelonephritis.  Doubt AAA, renal artery dissection, given intact distal pulses, and minimal abdominal tenderness.  Additionally, patient had imaging with renal stone study in May 2019 which showed non-aneurysmal aorta.  Lab work significant for improved renal  function.  No leukocytosis.  Urinalysis with hematuria but no evidence of infection.  CT abdomen and pelvis demonstrating left hydronephrosis with hydroureter with mild perinephric stranding.  No clear renal stone is identified, however patient does have a nonobstructive 6 mm stone within the left renal pelvis.  Suspect recently passed kidney stone based on clinical picture.  After initial dose of analgesia, patient had near resolution of his symptoms with no further pain, and this is consistent with recently passed stone.  Incidentally, patient found to have lesion of the liver with some biliary dilatation.  Raises concerns for cholangiocarcinoma.  Patient was given a printout of his report, and instructed to follow closely with his primary care provider.  Ambulatory referral to gastroenterology additionally placed.    Patient to have close follow-up with his urologist, with whom he is obtaining imaging in 2 days.  Return precautions were given for any worsening pain or intractable nausea or vomiting.  I have reviewed the patient's information in the [facility] for the past 12 months and found them to have no Rx overlapping.  Opiates were prescribed for an acute, painful condition. The patient was given information on side effects and encouraged to use other, non-opiate pain medication primary, only using opiate medicine sparingly for severe pain.  This is a supervised visit with Dr. Gerlene Fee. Evaluation, management, and discharge planning discussed with this attending physician.  Final Clinical Impressions(s) / ED Diagnoses   Final diagnoses:  Nephrolithiasis  Liver lesion  Elevated blood pressure reading with diagnosis of hypertension    ED Discharge Orders         Ordered    Ambulatory referral to Gastroenterology     06/19/18 2103    HYDROcodone-acetaminophen (NORCO/VICODIN) 5-325 MG tablet  Every 6 hours PRN     06/19/18 2105           Albesa Seen, PA-C 06/20/18 0200    Maudie Flakes, MD 06/20/18 2319

## 2018-06-19 NOTE — ED Notes (Signed)
Pt tolerated PO liquids at this time.

## 2018-06-19 NOTE — ED Triage Notes (Signed)
Pt c/o left flank pain x 1 hr

## 2018-06-19 NOTE — ED Notes (Signed)
Pt unable to urinate at this time- reports onset today. Hx of prostate issues with MRI scheduled this week.

## 2018-06-19 NOTE — Discharge Instructions (Addendum)
Please read and follow all provided instructions.  Your diagnoses today include:  1. Nephrolithiasis   2. Liver lesion   3. Elevated blood pressure reading with diagnosis of hypertension     Tests performed today include: Urine test that showed blood in your urine and no infection CT scan which showed a likely recently passed kidney stone. Blood test that showed normal kidney function Vital signs. See below for your results today.   Your CAT scan also showed a lesion on the liver which is similar to prior, however there is a concern about dilation of your biliary system.  This will require an MRI as an outpatient.  This will need an MRI specifically of the liver.  Medications prescribed:   Take any prescribed medications only as directed.  You have been prescribed Norco for pain. This is an opioid pain medication. You may take this medication every 4-6 hours as needed for pain. Only take this medication if you need it for breakthrough pain.  Do not combine this medication with Tylenol, as it may increase the risk of liver problems.  Do not combine this medication with alcohol.  Please be advised to avoid driving or operating heavy machinery while taking this medication, as it may make you drowsy or impair judgment.    Home care instructions:  Follow any educational materials contained in this packet.  Please double your fluid intake for the next several days. Strain your urine and save any stones that may pass.   BE VERY CAREFUL not to take multiple medicines containing Tylenol (also called acetaminophen). Doing so can lead to an overdose which can damage your liver and cause liver failure and possibly death.   Follow-up instructions: Please follow-up with your urologist or the urologist referral (provided on front page) in the next 1 week for further evaluation of your symptoms.  If you need to return to the Emergency Department, go to [facility] and not [facility]. The urologists are located at [facility] and can better care for you at this location.  Return instructions:  If you need to return to the Emergency Department, go to [facility] and not [facility]. The urologists are located at [facility] and can better care for you at this location.  Please return to the Emergency Department if you experience worsening symptoms.  Please return if you develop fever or uncontrolled pain or vomiting. Please return if you have any other emergent concerns.  Additional Information:  Your vital signs today were: BP (!) 169/105    Pulse 70    Temp 97.9 F (36.6 C) (Oral)    Resp 15    Ht 5\' 11"  (1.803 m)    Wt 102.5 kg    SpO2 98%    BMI 31.52 kg/m  If your blood pressure (BP) was elevated above 135/85 this visit, please have this repeated by your doctor within one month. --------------

## 2018-07-03 NOTE — Progress Notes (Signed)
Advanced Heart Failure Clinic Note    Primary Care: [facility] Primary Cardiologist: Dr. Radford Pax    HPI: David Underwood is a 67 y.o. male with history of GERD, colon CA s/p colostomy, HTN, CKD stage III, nonobstructive CAD, PVCs, orthostatic hypotension and junctional rhythm. Also with history of newly diagnosed systolic CHF, echo in 0/2637 with EF 20-25% grade 2 DD, mild AI, mild-mod MR, mod TR, PASP 40.   Admitted 12/19/16-12/22/16 with DOE, nausea and vomiting. Troponin 0.26, EKG showed sinus bradycardia with diffuse T wave abnormalities c/w inferolateral ischemia. Left heart cath showed nonobstructive CAD. Noted to have frequent PVC's on tele. He was started on Entresto at discharge.   Admitted 12/23/16-12/27/16 with near syncope, nausea and SOB. He was orthostatic sitting BP 136/95, standing 91/56. His beta blocker was stopped because of bradycardia and some junctional rhythms noted on tele. Frequent PVC's continued on tele. His Entresto was stopped. Discharge weight was 208 pounds.   cMRI 02/09/17 LVEF 20%, Normal RV, No LGE.   Was started on amiodarone. Recent monitor with PVC burden down to 5% on 10/18. Echo 11/18 EF 30-35%   Echo 11/2017: EF ~45% RV normal Personally reviewed by Dr Haroldine Laws  Seen by Dr Curt Bears for frequent PVCs. Repeat monitor showed 15% PVC burden 04/2018, so he was started on mexilitine. Plans to recheck PVC burden in 6 months to assess need for ablation.  He returns today for HF follow up. Last visit losartan was increased. Seen in ER 11/25 with flank pain. Found to have lesion on his liver and was referred to GI for further work up. Overall doing well. He stays active and is doing yardwork. Denies SOB, orthopnea, PND, or edema. No CP or dizziness. Wearing CPAP 2-3 times/week. No palpitations. Appetite and energy level okay. BP elevated on home checks SBP 140-189.  Review of systems complete and found to be negative unless listed in HPI.   Past Medical  History:  Diagnosis Date  . Back pain   . Chronic combined systolic and diastolic heart failure ([place]) 12/24/2016  . Colon cancer [facility])    s/p colostomy  . GERD (gastroesophageal reflux disease)    with associated esophagitis on PPI  . Hyperlipidemia   . Hypertension   . Junctional rhythm   . Myocardial infarction ([place])   . NICM (nonischemic cardiomyopathy) ([place])   . Nonobstructive atherosclerosis of coronary artery    a. 12/21/16- Cath: non-obstructive CAD, EF <25%.  . Orthostatic hypotension   . OSA on CPAP 10/26/2017   Moderate with AHI 28/hr now on CPAP at 11cm H2O.    . PVC's (premature ventricular contractions)     Current Outpatient Medications  Medication Sig Dispense Refill  . aspirin 81 MG EC tablet Take 1 tablet (81 mg total) by mouth daily. 30 tablet 11  . carvedilol (COREG) 6.25 MG tablet Take 1 tablet (6.25 mg total) by mouth 2 (two) times daily with a meal. 180 tablet 1  . co-enzyme Q-10 30 MG capsule Take 30 mg by mouth daily.    Marland Kitchen HYDROcodone-acetaminophen (NORCO/VICODIN) 5-325 MG tablet Take 1-2 tablets by mouth every 6 (six) hours as needed. 8 tablet 0  . losartan (COZAAR) 25 MG tablet Take 1 tablet (25 mg total) by mouth daily. 90 tablet 3  . mexiletine (MEXITIL) 250 MG capsule Take 1 capsule (250 mg total) by mouth 2 (two) times daily. 60 capsule 6  . rosuvastatin (CRESTOR) 20 MG tablet Take 20 mg by mouth at bedtime.  3  .  spironolactone (ALDACTONE) 25 MG tablet Take 1 tablet (25 mg total) by mouth daily. 90 tablet 1  . nitroGLYCERIN (NITROSTAT) 0.4 MG SL tablet Place 1 tablet (0.4 mg total) under the tongue every 5 (five) minutes x 3 doses as needed for chest pain. (Patient not taking: Reported on 07/10/2018) 25 tablet 12   No current facility-administered medications for this encounter.     Allergies  Allergen Reactions  . Aspirin Other (See Comments)    "stomach irritation", GI bleeding, also mentions colon CA      Social History   Socioeconomic History   . Marital status: Married    Spouse name: Not on file  . Number of children: Not on file  . Years of education: Not on file  . Highest education level: Not on file  Occupational History  . Not on file  Social Needs  . Financial resource strain: Not on file  . Food insecurity:    Worry: Not on file    Inability: Not on file  . Transportation needs:    Medical: Not on file    Non-medical: Not on file  Tobacco Use  . Smoking status: Never Smoker  . Smokeless tobacco: Never Used  Substance and Sexual Activity  . Alcohol use: No  . Drug use: No  . Sexual activity: Never  Lifestyle  . Physical activity:    Days per week: Not on file    Minutes per session: Not on file  . Stress: Not on file  Relationships  . Social connections:    Talks on phone: Not on file    Gets together: Not on file    Attends religious service: Not on file    Active member of club or organization: Not on file    Attends meetings of clubs or organizations: Not on file    Relationship status: Not on file  . Intimate partner violence:    Fear of current or ex partner: Not on file    Emotionally abused: Not on file    Physically abused: Not on file    Forced sexual activity: Not on file  Other Topics Concern  . Not on file  Social History Narrative  . Not on file      Family History  Problem Relation Age of Onset  . Colon cancer Mother   . Deep vein thrombosis Father   . Heart disease Brother   . Heart attack Brother     Vitals:   07/10/18 1026  BP: (!) 154/106  Pulse: 70  SpO2: 98%  Weight: 96.2 kg (212 lb)   Wt Readings from Last 3 Encounters:  07/10/18 96.2 kg (212 lb)  06/19/18 102.5 kg (226 lb)  05/30/18 94.3 kg (208 lb)    PHYSICAL EXAM: General: Well appearing. No resp difficulty. HEENT: Normal Neck: Supple. JVP 5-6. Carotids 2+ bilat; no bruits. No thyromegaly or nodule noted. Cor: PMI nondisplaced. RRR, No M/G/R noted Lungs: CTAB, normal effort. Abdomen: Soft,  non-tender, non-distended, no HSM. No bruits or masses. +BS  Extremities: No cyanosis, clubbing, or rash. R and LLE no edema.  Neuro: Alert & orientedx3, cranial nerves grossly intact. moves all 4 extremities w/o difficulty. Affect pleasant   ASSESSMENT & PLAN:  1. Chronic systolic HF - due to NICM -> likely PVC-mediated +/- HTN or OSA. EF  20-25%. Cath 5/18. Non-obstructive CAD - cMRI 7/18 with LVEF 20%, normal RV, and no LGE - echo 11/18 EF 30-35%. No ICD yet as EF improving.  -  Echo 11/2017 EF 45% improving with PVC suppression. Now off amiodarone. See discussion below. - Stable NYHA II symptoms.  - Volume status stable off lasix. - Continue spironolactone 25 daily.  - Increase losartan to 25 mg BID. Did not tolerate Entresto 24/26 with hypotension. Will not retry with improvement in EF. BMET in 2 weeks - Continue carvedilol 6.25 bid - PVC burden back up to 15% 04/2018. Started on mexilitine. See below.  - Reinforced fluid restriction to < 2 L daily, sodium restriction to less than 2000 mg daily, and the importance of daily weights.    2. CAD, nonobstructive - Continue ASA. Off crestor with muscle weakness that resolved after stopping.   - No s/s ischemia.   3. OSA - Only wearing CPAP 2 nights/week now. Again, encouraged compliance.   4. PVCs - Burden > 10% on amio via Holter monitor 02/18/17.  - Holter 10/18 PVC burden 5%.  - Follows with Dr Curt Bears. Amio stopped in July by Dr Curt Bears.  - Repeat 48 hour monitor 04/2018 showed 15% PVCs. He was started on mexilitine 250 mg BID with plans for 6 month repeat monitor to reassess for possible ablation.   5. HTN - Elevated today. Increase losartan as above  Doing well from HF prespective. Losartan increased with elevated BP. Check BMET in 2 weeks.  Follow up in 3-4 months.   Georgiana Shore, NP  10:36 AM   Greater than 50% of the 25 minute visit was spent in counseling/coordination of care regarding disease state education,  salt/fluid restriction, sliding scale diuretics, and medication compliance.

## 2018-07-10 ENCOUNTER — Ambulatory Visit (HOSPITAL_COMMUNITY)
Admission: RE | Admit: 2018-07-10 | Discharge: 2018-07-10 | Disposition: A | Discharge status: Home / Self Care | Payer: Medicare HMO | Source: Ambulatory Visit | Attending: Internal Medicine | Admitting: Internal Medicine

## 2018-07-10 ENCOUNTER — Encounter (HOSPITAL_COMMUNITY): Payer: Self-pay

## 2018-07-10 VITALS — BP 154/106 | HR 70 | Wt 212.0 lb

## 2018-07-10 DIAGNOSIS — Z886 Allergy status to analgesic agent status: Secondary | ICD-10-CM | POA: Insufficient documentation

## 2018-07-10 DIAGNOSIS — I493 Ventricular premature depolarization: Secondary | ICD-10-CM | POA: Insufficient documentation

## 2018-07-10 DIAGNOSIS — I10 Essential (primary) hypertension: Secondary | ICD-10-CM

## 2018-07-10 DIAGNOSIS — I428 Other cardiomyopathies: Secondary | ICD-10-CM | POA: Insufficient documentation

## 2018-07-10 DIAGNOSIS — G4733 Obstructive sleep apnea (adult) (pediatric): Secondary | ICD-10-CM | POA: Diagnosis not present

## 2018-07-10 DIAGNOSIS — E785 Hyperlipidemia, unspecified: Secondary | ICD-10-CM | POA: Insufficient documentation

## 2018-07-10 DIAGNOSIS — N183 Chronic kidney disease, stage 3 (moderate): Secondary | ICD-10-CM | POA: Insufficient documentation

## 2018-07-10 DIAGNOSIS — I130 Hypertensive heart and chronic kidney disease with heart failure and stage 1 through stage 4 chronic kidney disease, or unspecified chronic kidney disease: Secondary | ICD-10-CM | POA: Insufficient documentation

## 2018-07-10 DIAGNOSIS — I951 Orthostatic hypotension: Secondary | ICD-10-CM | POA: Diagnosis not present

## 2018-07-10 DIAGNOSIS — Z8249 Family history of ischemic heart disease and other diseases of the circulatory system: Secondary | ICD-10-CM | POA: Insufficient documentation

## 2018-07-10 DIAGNOSIS — I5042 Chronic combined systolic (congestive) and diastolic (congestive) heart failure: Secondary | ICD-10-CM

## 2018-07-10 DIAGNOSIS — Z800 Family history of malignant neoplasm of digestive organs: Secondary | ICD-10-CM | POA: Diagnosis not present

## 2018-07-10 DIAGNOSIS — Z85038 Personal history of other malignant neoplasm of large intestine: Secondary | ICD-10-CM | POA: Diagnosis not present

## 2018-07-10 DIAGNOSIS — Z933 Colostomy status: Secondary | ICD-10-CM | POA: Diagnosis not present

## 2018-07-10 DIAGNOSIS — K219 Gastro-esophageal reflux disease without esophagitis: Secondary | ICD-10-CM | POA: Insufficient documentation

## 2018-07-10 DIAGNOSIS — I2510 Atherosclerotic heart disease of native coronary artery without angina pectoris: Secondary | ICD-10-CM | POA: Diagnosis not present

## 2018-07-10 DIAGNOSIS — I252 Old myocardial infarction: Secondary | ICD-10-CM | POA: Diagnosis not present

## 2018-07-10 DIAGNOSIS — Z7982 Long term (current) use of aspirin: Secondary | ICD-10-CM | POA: Insufficient documentation

## 2018-07-10 DIAGNOSIS — Z79899 Other long term (current) drug therapy: Secondary | ICD-10-CM | POA: Insufficient documentation

## 2018-07-10 MED ORDER — LOSARTAN POTASSIUM 25 MG PO TABS: 25.0000 mg | ORAL_TABLET | Freq: Two times a day (BID) | ORAL | 3 refills | Status: AC

## 2018-07-10 NOTE — Patient Instructions (Signed)
INCREASE Losartan to 25 mg, one tab twice a day  Labs needed in 2 weeks  Your physician recommends that you schedule a follow-up appointment in: 3-4 months  in the Advanced Practitioners (PA/NP) Clinic     Do the following things EVERYDAY: 1) Weigh yourself in the morning before breakfast. Write it down and keep it in a log. 2) Take your medicines as prescribed 3) Eat low salt foods-Limit salt (sodium) to 2000 mg per day.  4) Stay as active as you can everyday 5) Limit all fluids for the day to less than 2 liters    At the [facility], you and your health needs are our priority. As part of our continuing mission to provide you with exceptional heart care, we have created designated Provider Care Teams. These Care Teams include your primary Cardiologist (physician) and Advanced Practice Providers (APPs- Physician Assistants and Nurse Practitioners) who all work together to provide you with the care you need, when you need it.   You may see any of the following providers on your designated Care Team at your next follow up: Marland Kitchen Dr Glori Bickers . Dr Loralie Champagne . Darrick Grinder, NP . Lillia Mountain, NP . Rebecca Eaton

## 2018-07-24 ENCOUNTER — Ambulatory Visit (HOSPITAL_COMMUNITY)
Admission: RE | Admit: 2018-07-24 | Discharge: 2018-07-24 | Disposition: A | Discharge status: Home / Self Care | Payer: Medicare HMO | Source: Ambulatory Visit | Attending: Cardiology | Admitting: Cardiology

## 2018-07-24 DIAGNOSIS — I5042 Chronic combined systolic (congestive) and diastolic (congestive) heart failure: Secondary | ICD-10-CM

## 2018-07-24 LAB — BASIC METABOLIC PANEL
ANION GAP: 13 (ref 5–15)
BUN: 12 mg/dL (ref 8–23)
CALCIUM: 9.3 mg/dL (ref 8.9–10.3)
CO2: 21 mmol/L — ABNORMAL LOW (ref 22–32)
CREATININE: 1.71 mg/dL — AB (ref 0.61–1.24)
Chloride: 106 mmol/L (ref 98–111)
GFR, EST AFRICAN AMERICAN: 47 mL/min — AB (ref >60)
GFR, EST NON AFRICAN AMERICAN: 41 mL/min — AB (ref >60)
Glucose, Bld: 95 mg/dL (ref 70–99)
Potassium: 4.1 mmol/L (ref 3.5–5.1)
Sodium: 140 mmol/L (ref 135–145)

## 2018-07-25 ENCOUNTER — Encounter (HOSPITAL_COMMUNITY): Payer: Self-pay

## 2018-07-25 ENCOUNTER — Other Ambulatory Visit (HOSPITAL_COMMUNITY): Payer: Medicare HMO

## 2018-07-30 ENCOUNTER — Emergency Department (HOSPITAL_BASED_OUTPATIENT_CLINIC_OR_DEPARTMENT_OTHER)
Admission: EM | Admit: 2018-07-30 | Discharge: 2018-07-30 | Disposition: A | Discharge status: Home / Self Care | Payer: Medicare HMO | Attending: Emergency Medicine | Admitting: Emergency Medicine

## 2018-07-30 ENCOUNTER — Emergency Department (HOSPITAL_BASED_OUTPATIENT_CLINIC_OR_DEPARTMENT_OTHER): Payer: Medicare HMO

## 2018-07-30 ENCOUNTER — Encounter (HOSPITAL_BASED_OUTPATIENT_CLINIC_OR_DEPARTMENT_OTHER): Payer: Self-pay | Admitting: Emergency Medicine

## 2018-07-30 ENCOUNTER — Other Ambulatory Visit: Payer: Self-pay

## 2018-07-30 DIAGNOSIS — I252 Old myocardial infarction: Secondary | ICD-10-CM | POA: Diagnosis not present

## 2018-07-30 DIAGNOSIS — N200 Calculus of kidney: Secondary | ICD-10-CM | POA: Diagnosis not present

## 2018-07-30 DIAGNOSIS — Z79899 Other long term (current) drug therapy: Secondary | ICD-10-CM | POA: Insufficient documentation

## 2018-07-30 DIAGNOSIS — I110 Hypertensive heart disease with heart failure: Secondary | ICD-10-CM | POA: Insufficient documentation

## 2018-07-30 DIAGNOSIS — K8050 Calculus of bile duct without cholangitis or cholecystitis without obstruction: Secondary | ICD-10-CM | POA: Insufficient documentation

## 2018-07-30 DIAGNOSIS — I2510 Atherosclerotic heart disease of native coronary artery without angina pectoris: Secondary | ICD-10-CM | POA: Insufficient documentation

## 2018-07-30 DIAGNOSIS — R1032 Left lower quadrant pain: Secondary | ICD-10-CM | POA: Diagnosis present

## 2018-07-30 DIAGNOSIS — I5042 Chronic combined systolic (congestive) and diastolic (congestive) heart failure: Secondary | ICD-10-CM | POA: Diagnosis not present

## 2018-07-30 DIAGNOSIS — K7689 Other specified diseases of liver: Secondary | ICD-10-CM

## 2018-07-30 DIAGNOSIS — K8020 Calculus of gallbladder without cholecystitis without obstruction: Secondary | ICD-10-CM

## 2018-07-30 LAB — CBC WITH DIFFERENTIAL/PLATELET
ABS IMMATURE GRANULOCYTES: 0.03 10*3/uL (ref 0.00–0.07)
BASOS ABS: 0 10*3/uL (ref 0.0–0.1)
Basophils Relative: 0 %
EOS ABS: 0 10*3/uL (ref 0.0–0.5)
Eosinophils Relative: 0 %
HEMATOCRIT: 42.2 % (ref 39.0–52.0)
Hemoglobin: 14.1 g/dL (ref 13.0–17.0)
IMMATURE GRANULOCYTES: 0 %
LYMPHS ABS: 1 10*3/uL (ref 0.7–4.0)
LYMPHS PCT: 10 %
MCH: 30.3 pg (ref 26.0–34.0)
MCHC: 33.4 g/dL (ref 30.0–36.0)
MCV: 90.8 fL (ref 80.0–100.0)
MONOS PCT: 6 %
Monocytes Absolute: 0.6 10*3/uL (ref 0.1–1.0)
NEUTROS ABS: 8.1 10*3/uL — AB (ref 1.7–7.7)
NEUTROS PCT: 84 %
NRBC: 0 % (ref 0.0–0.2)
Platelets: 209 10*3/uL (ref 150–400)
RBC: 4.65 MIL/uL (ref 4.22–5.81)
RDW: 11.9 % (ref 11.5–15.5)
WBC: 9.7 10*3/uL (ref 4.0–10.5)

## 2018-07-30 LAB — URINALYSIS, ROUTINE W REFLEX MICROSCOPIC
Bilirubin Urine: NEGATIVE
Glucose, UA: NEGATIVE mg/dL
Ketones, ur: NEGATIVE mg/dL
NITRITE: NEGATIVE
PH: 5.5 (ref 5.0–8.0)
PROTEIN: 30 mg/dL — AB
Specific Gravity, Urine: 1.025 (ref 1.005–1.030)

## 2018-07-30 LAB — BASIC METABOLIC PANEL
Anion gap: 7 (ref 5–15)
BUN: 22 mg/dL (ref 8–23)
CO2: 22 mmol/L (ref 22–32)
Calcium: 9.2 mg/dL (ref 8.9–10.3)
Chloride: 107 mmol/L (ref 98–111)
Creatinine, Ser: 2.24 mg/dL — ABNORMAL HIGH (ref 0.61–1.24)
GFR calc Af Amer: 34 mL/min — ABNORMAL LOW (ref >60)
GFR calc non Af Amer: 29 mL/min — ABNORMAL LOW (ref >60)
Glucose, Bld: 113 mg/dL — ABNORMAL HIGH (ref 70–99)
Potassium: 4.2 mmol/L (ref 3.5–5.1)
SODIUM: 136 mmol/L (ref 135–145)

## 2018-07-30 LAB — URINALYSIS, MICROSCOPIC (REFLEX): RBC / HPF: >50 RBC/hpf (ref 0–5)

## 2018-07-30 MED ORDER — FENTANYL CITRATE (PF) 100 MCG/2ML IJ SOLN
50.0000 ug | Freq: Once | INTRAMUSCULAR | Status: AC
  Administered 2018-07-30: 50 ug via INTRAVENOUS
  Filled 2018-07-30: qty 2

## 2018-07-30 MED ORDER — ONDANSETRON HCL 4 MG/2ML IJ SOLN
4.0000 mg | Freq: Once | INTRAMUSCULAR | Status: AC
  Administered 2018-07-30: 4 mg via INTRAVENOUS
  Filled 2018-07-30: qty 2

## 2018-07-30 NOTE — ED Provider Notes (Signed)
[facility] EMERGENCY DEPARTMENT Provider Note   CSN: [id number] Arrival date & time: 07/30/18  1242     History   Chief Complaint Chief Complaint  Patient presents with  . Flank Pain    urinary retention     HPI David Underwood is a 68 y.o. male.  Patient is a 68 year old male with a history of prior colon cancer status post colectomy and prior kidney stones who presents with left flank pain.  He states that started during the night last night is been fairly constant since this morning.  He took 1 of his home pain pills with some improvement in symptoms.  He has had some nausea and vomiting and decreased urination.  He did have a recent prostate biopsy done about 4 days ago and has had some intermittent hematuria related to this.  He is followed by Dr. Burnell Blanks with urology in [facility].     Past Medical History:  Diagnosis Date  . Back pain   . CHF (congestive heart failure) ([place])   . Chronic combined systolic and diastolic heart failure ([place]) 12/24/2016  . Colon cancer [facility])    s/p colostomy  . GERD (gastroesophageal reflux disease)    with associated esophagitis on PPI  . Hyperlipidemia   . Hypertension   . Junctional rhythm   . Myocardial infarction ([place])   . NICM (nonischemic cardiomyopathy) ([place])   . Nonobstructive atherosclerosis of coronary artery    a. 12/21/16- Cath: non-obstructive CAD, EF <25%.  . Orthostatic hypotension   . OSA on CPAP 10/26/2017   Moderate with AHI 28/hr now on CPAP at 11cm H2O.    . PVC's (premature ventricular contractions)     Patient Active Problem List   Diagnosis Date Noted  . OSA on CPAP 10/26/2017  . PVC's (premature ventricular contractions) 12/27/2016  . Benign essential HTN 12/27/2016  . Chronic combined systolic and diastolic heart failure ([place]) 12/24/2016  . NICM (nonischemic cardiomyopathy) ([place]) 12/24/2016  . Syncope and collapse 12/23/2016  . Coronary artery disease, non-occlusive with cath 12/21/16  12/22/2016  . Pure hypercholesterolemia     Past Surgical History:  Procedure Laterality Date  . COLON SURGERY    . LEFT HEART CATH AND CORONARY ANGIOGRAPHY N/A 12/21/2016   Procedure: Left Heart Cath and Coronary Angiography;  Surgeon: Martinique, Peter M, MD;  Location: [facility];  Service: Cardiovascular;  Laterality: N/A;        Home Medications    Prior to Admission medications   Medication Sig Start Date End Date Taking? Authorizing Provider  carvedilol (COREG) 6.25 MG tablet Take 1 tablet (6.25 mg total) by mouth 2 (two) times daily with a meal. 01/25/18 07/30/18 Yes Camnitz, Will Hassell Done, MD  furosemide (LASIX) 20 MG tablet Take 20 mg by mouth.   Yes [provider]  HYDROcodone-acetaminophen (NORCO/VICODIN) 5-325 MG tablet Take 1-2 tablets by mouth every 6 (six) hours as needed. 06/19/18  Yes Valere Dross, Alyssa B, PA-C  losartan (COZAAR) 25 MG tablet Take 1 tablet (25 mg total) by mouth 2 (two) times daily. 07/10/18 10/08/18 Yes Georgiana Shore, NP  rosuvastatin (CRESTOR) 20 MG tablet Take 20 mg by mouth at bedtime. 04/08/18  Yes [provider]  spironolactone (ALDACTONE) 25 MG tablet Take 1 tablet (25 mg total) by mouth daily. 01/25/18  Yes Camnitz, Ocie Doyne, MD  aspirin 81 MG EC tablet Take 1 tablet (81 mg total) by mouth daily. 12/23/16   Delos Haring, PA-C  co-enzyme Q-10 30  MG capsule Take 30 mg by mouth daily.    [provider]  mexiletine (MEXITIL) 250 MG capsule Take 1 capsule (250 mg total) by mouth 2 (two) times daily. 05/30/18   Camnitz, Ocie Doyne, MD  nitroGLYCERIN (NITROSTAT) 0.4 MG SL tablet Place 1 tablet (0.4 mg total) under the tongue every 5 (five) minutes x 3 doses as needed for chest pain. Patient not taking: Reported on 07/10/2018 12/22/16   Delos Haring, PA-C    Family History Family History  Problem Relation Age of Onset  . Colon cancer Mother   . Deep vein thrombosis Father   . Heart disease Brother   . Heart attack  Brother     Social History Social History   Tobacco Use  . Smoking status: Never Smoker  . Smokeless tobacco: Never Used  Substance Use Topics  . Alcohol use: No  . Drug use: No     Allergies   Aspirin   Review of Systems Review of Systems  Constitutional: Negative for chills, diaphoresis, fatigue and fever.  HENT: Negative for congestion, rhinorrhea and sneezing.   Eyes: Negative.   Respiratory: Negative for cough, chest tightness and shortness of breath.   Cardiovascular: Negative for chest pain and leg swelling.  Gastrointestinal: Positive for abdominal pain, nausea and vomiting. Negative for blood in stool and diarrhea.  Genitourinary: Positive for difficulty urinating, flank pain and hematuria. Negative for frequency.  Musculoskeletal: Positive for back pain. Negative for arthralgias.  Skin: Negative for rash.  Neurological: Negative for dizziness, speech difficulty, weakness, numbness and headaches.     Physical Exam Updated Vital Signs BP (!) 187/97 (BP Location: Left Arm)   Pulse 65   Temp 98.5 F (36.9 C) (Oral)   Resp 16   Ht 5\' 11"  (1.803 m)   Wt 97.1 kg   SpO2 98%   BMI 29.85 kg/m   Physical Exam Constitutional:      Appearance: He is well-developed.  HENT:     Head: Normocephalic and atraumatic.  Eyes:     Pupils: Pupils are equal, round, and reactive to light.  Neck:     Musculoskeletal: Normal range of motion and neck supple.  Cardiovascular:     Rate and Rhythm: Normal rate and regular rhythm.     Heart sounds: Normal heart sounds.  Pulmonary:     Effort: Pulmonary effort is normal. No respiratory distress.     Breath sounds: Normal breath sounds. No wheezing or rales.  Chest:     Chest wall: No tenderness.  Abdominal:     General: Bowel sounds are normal.     Palpations: Abdomen is soft.     Tenderness: There is abdominal tenderness. There is no guarding or rebound.     Comments: Left flank  Musculoskeletal: Normal range of motion.   Lymphadenopathy:     Cervical: No cervical adenopathy.  Skin:    General: Skin is warm and dry.     Findings: No rash.  Neurological:     Mental Status: He is alert and oriented to person, place, and time.      ED Treatments / Results  Labs (all labs ordered are listed, but only abnormal results are displayed) Labs Reviewed  BASIC METABOLIC PANEL - Abnormal; Notable for the following components:      Result Value   Glucose, Bld 113 (*)    Creatinine, Ser 2.24 (*)    GFR calc non Af Amer 29 (*)    GFR calc Af Wyvonnia Lora  34 (*)    All other components within normal limits  CBC WITH DIFFERENTIAL/PLATELET - Abnormal; Notable for the following components:   Neutro Abs 8.1 (*)    All other components within normal limits  URINALYSIS, ROUTINE W REFLEX MICROSCOPIC - Abnormal; Notable for the following components:   Hgb urine dipstick LARGE (*)    Protein, ur 30 (*)    Leukocytes, UA TRACE (*)    All other components within normal limits  URINALYSIS, MICROSCOPIC (REFLEX) - Abnormal; Notable for the following components:   Bacteria, UA FEW (*)    All other components within normal limits    EKG None  Radiology Ct Renal Stone Study  Result Date: 07/30/2018 CLINICAL DATA:  Left lower quadrant pain and flank pain. Urinary retention. EXAM: CT ABDOMEN AND PELVIS WITHOUT CONTRAST TECHNIQUE: Multidetector CT imaging of the abdomen and pelvis was performed following the standard protocol without IV contrast. COMPARISON:  CT scan 06/19/2018 FINDINGS: Lower chest: Mild scarring in the posterior basal segment left lower lobe. Subsegmental atelectasis or scarring in the lingula. Symmetric gynecomastia. Left anterior descending coronary artery atherosclerotic vascular disease. Hepatobiliary: Abnormal shrunken appearance of the lateral segment left hepatic lobe with internal hypodensity possibly from local biliary dilatation. Not appreciably changed from 06/19/2018. Numerous gallstones are present  filling the gallbladder. Gallbladder wall thickening could not be readily excluded. Pancreas: Unremarkable Spleen: Unremarkable Adrenals/Urinary Tract: Fullness of the left adrenal gland. Mild to moderate left hydronephrosis and moderate left hydroureter extending down to a 4 mm left UVJ calculus. There is a 5 mm left kidney lower pole nonobstructive renal calculus. No right-sided renal calculi are identified. Stomach/Bowel: Colectomy with right ostomy noted. Vascular/Lymphatic: Aortoiliac atherosclerotic vascular disease. Scattered small porta hepatis lymph nodes are not pathologically enlarged. Reproductive: Calcified vas deferens. Abnormal fullness of the prostate gland with indistinct margins and presacral density as on prior exams. Some of this may be treatment related. Other: No supplemental non-categorized findings. Musculoskeletal: Bridging spurring of the left sacroiliac joint. Degenerative disc disease contributing to mild impingement at L3-4, L4-5, and L5-S1. IMPRESSION: 1. Obstructive 4 mm left UVJ calculus. Nonobstructive 5 mm left kidney lower pole calculus. Moderate left hydronephrosis and hydroureter. 2. Chronic enlargement and marginal indistinctness of the prostate gland with stable presacral density. 3. Stable appearance of shrunken lateral segment left hepatic lobe potentially with biliary prominence. As noted on the prior exam from November, cholangiocarcinoma can sometimes cause a similar appearance, although the lack of progressive mass would be unusual for cholangiocarcinoma. Dedicated hepatic protocol MRI with and without contrast may be warranted for further characterization. 4.  Aortic Atherosclerosis (ICD10-I70.0).  Coronary atherosclerosis. 5. Mild impingement in the lower lumbar spine primarily from degenerative disc disease. 6. Numerous gallstones fill the gallbladder. 7. Symmetric gynecomastia. Electronically Signed   By: Van Clines M.D.   On: 07/30/2018 14:12     Procedures Procedures (including critical care time)  Medications Ordered in ED Medications  fentaNYL (SUBLIMAZE) injection 50 mcg (50 mcg Intravenous Given 07/30/18 1346)  ondansetron (ZOFRAN) injection 4 mg (4 mg Intravenous Given 07/30/18 1345)     Initial Impression / Assessment and Plan / ED Course  I have reviewed the triage vital signs and the nursing notes.  Pertinent labs & imaging results that were available during my care of the patient were reviewed by me and considered in my medical decision making (see chart for details).     Patient is a 68 year old male who presents with left flank pain.  He has  evidence of four 4 mm distal left UVJ stone.  His pain is well controlled in the emergency department with 1 dose of fentanyl.  He has no evidence of infection on his urinalysis.  His creatinine is 2.24.  Over the last year it is ranged from 1.5-2.35.  I advised him to have this followed up by his primary care physician.  I encouraged him to have close follow-up with his urologist.  He is able to urinate in the ED without significant difficulty.  He has some other findings on his CT such as gallstones which is a known finding and some abnormalities of his liver which also is a known finding.  He had a recent MRCP of his liver which showed similar results.  He was given strict return precautions.  Final Clinical Impressions(s) / ED Diagnoses   Final diagnoses:  Kidney stone  Liver nodule  Gallstones    ED Discharge Orders    None       Malvin Johns, MD 07/30/18 1601

## 2018-07-30 NOTE — ED Triage Notes (Addendum)
Left flank pain that radiates to lower abd . Also having urinary retention, last voided around 1300 yesterday . N/V . Prostate BX was done on this past Wed

## 2018-07-30 NOTE — Discharge Instructions (Signed)
Your creatinine is mildly elevated from her baseline values and needs to be followed up on by your urologist or primary care physician.

## 2018-08-07 ENCOUNTER — Other Ambulatory Visit: Payer: Self-pay

## 2018-08-07 ENCOUNTER — Encounter (HOSPITAL_COMMUNITY): Payer: Self-pay

## 2018-08-07 ENCOUNTER — Ambulatory Visit (HOSPITAL_COMMUNITY)
Admission: RE | Admit: 2018-08-07 | Discharge: 2018-08-07 | Disposition: A | Discharge status: Home / Self Care | Payer: Medicare HMO | Source: Ambulatory Visit | Attending: Internal Medicine | Admitting: Internal Medicine

## 2018-08-07 VITALS — BP 138/92 | HR 76 | Wt 212.0 lb

## 2018-08-07 DIAGNOSIS — I252 Old myocardial infarction: Secondary | ICD-10-CM | POA: Diagnosis not present

## 2018-08-07 DIAGNOSIS — Z800 Family history of malignant neoplasm of digestive organs: Secondary | ICD-10-CM | POA: Insufficient documentation

## 2018-08-07 DIAGNOSIS — G4733 Obstructive sleep apnea (adult) (pediatric): Secondary | ICD-10-CM

## 2018-08-07 DIAGNOSIS — N179 Acute kidney failure, unspecified: Secondary | ICD-10-CM | POA: Insufficient documentation

## 2018-08-07 DIAGNOSIS — I10 Essential (primary) hypertension: Secondary | ICD-10-CM

## 2018-08-07 DIAGNOSIS — Z886 Allergy status to analgesic agent status: Secondary | ICD-10-CM | POA: Diagnosis not present

## 2018-08-07 DIAGNOSIS — Z79899 Other long term (current) drug therapy: Secondary | ICD-10-CM | POA: Diagnosis not present

## 2018-08-07 DIAGNOSIS — I130 Hypertensive heart and chronic kidney disease with heart failure and stage 1 through stage 4 chronic kidney disease, or unspecified chronic kidney disease: Secondary | ICD-10-CM | POA: Diagnosis present

## 2018-08-07 DIAGNOSIS — I493 Ventricular premature depolarization: Secondary | ICD-10-CM | POA: Diagnosis not present

## 2018-08-07 DIAGNOSIS — K219 Gastro-esophageal reflux disease without esophagitis: Secondary | ICD-10-CM | POA: Insufficient documentation

## 2018-08-07 DIAGNOSIS — C61 Malignant neoplasm of prostate: Secondary | ICD-10-CM | POA: Insufficient documentation

## 2018-08-07 DIAGNOSIS — E785 Hyperlipidemia, unspecified: Secondary | ICD-10-CM | POA: Insufficient documentation

## 2018-08-07 DIAGNOSIS — N183 Chronic kidney disease, stage 3 (moderate): Secondary | ICD-10-CM | POA: Diagnosis not present

## 2018-08-07 DIAGNOSIS — Z7982 Long term (current) use of aspirin: Secondary | ICD-10-CM | POA: Insufficient documentation

## 2018-08-07 DIAGNOSIS — I2510 Atherosclerotic heart disease of native coronary artery without angina pectoris: Secondary | ICD-10-CM | POA: Diagnosis not present

## 2018-08-07 DIAGNOSIS — Z8249 Family history of ischemic heart disease and other diseases of the circulatory system: Secondary | ICD-10-CM | POA: Insufficient documentation

## 2018-08-07 DIAGNOSIS — I428 Other cardiomyopathies: Secondary | ICD-10-CM | POA: Insufficient documentation

## 2018-08-07 DIAGNOSIS — I5042 Chronic combined systolic (congestive) and diastolic (congestive) heart failure: Secondary | ICD-10-CM

## 2018-08-07 DIAGNOSIS — Z933 Colostomy status: Secondary | ICD-10-CM | POA: Diagnosis not present

## 2018-08-07 DIAGNOSIS — Z85038 Personal history of other malignant neoplasm of large intestine: Secondary | ICD-10-CM | POA: Insufficient documentation

## 2018-08-07 NOTE — Patient Instructions (Signed)
EKG done today  Follow up with Dr. Haroldine Laws in 3 months

## 2018-08-07 NOTE — Progress Notes (Signed)
Advanced Heart Failure Clinic Note    Primary Care: [facility] Primary Cardiologist: Dr. Radford Pax  Urology: [facility] Dr Daphane Shepherd    HPI: David Underwood is a 68 y.o. male with history of GERD, colon CA s/p colostomy, HTN, CKD stage III, nonobstructive CAD, PVCs, orthostatic hypotension and junctional rhythm, and prostate cancer. Also with historyof systolic CHF, echo in 02/5630 with EF 20-25% grade 2 DD, mild AI, mild-mod MR, mod TR, PASP 40.   Admitted 12/19/16-12/22/16 with DOE, nausea and vomiting. Troponin 0.26, EKG showed sinus bradycardia with diffuse T wave abnormalities c/w inferolateral ischemia. Left heart cath showed nonobstructive CAD. Noted to have frequent PVC's on tele. He was started on Entresto at discharge.   Admitted 12/23/16-12/27/16 with near syncope, nausea and SOB. He was orthostatic sitting BP 136/95, standing 91/56. His beta blocker was stopped because of bradycardia and some junctional rhythms noted on tele. Frequent PVC's continued on tele. His Entresto was stopped. Discharge weight was 208 pounds.   cMRI 02/09/17 LVEF 20%, Normal RV, No LGE.   Was started on amiodarone. Recent monitor with PVC burden down to 5% on 10/18. Echo 11/18 EF 30-35%   Echo 11/2017: EF ~45% RV normal Personally reviewed by Dr Haroldine Laws  Seen by Dr Curt Bears for frequent PVCs. Repeat monitor showed 15% PVC burden 04/2018, so he was started on mexiletine but this was later stopped due to nausea.  Plans to recheck PVC burden in 6 months to assess need for ablation.   He returns for HF follow up. Since the last visit he was developed a kidney stop and has had follow up with urology. Overall feeling fine. BP at home has been stable 130 over the 70s.  Plan to start radiation for prostate cancer soon . Denies SOB/PND/Orthopnea. Appetite fair. No fever or chills. Not weighing at home. Taking all medications.   Review of systems complete and found to be negative unless listed in HPI.   Past  Medical History:  Diagnosis Date  . Back pain   . CHF (congestive heart failure) ([place])   . Chronic combined systolic and diastolic heart failure ([place]) 12/24/2016  . Colon cancer [facility])    s/p colostomy  . GERD (gastroesophageal reflux disease)    with associated esophagitis on PPI  . Hyperlipidemia   . Hypertension   . Junctional rhythm   . Myocardial infarction ([place])   . NICM (nonischemic cardiomyopathy) ([place])   . Nonobstructive atherosclerosis of coronary artery    a. 12/21/16- Cath: non-obstructive CAD, EF <25%.  . Orthostatic hypotension   . OSA on CPAP 10/26/2017   Moderate with AHI 28/hr now on CPAP at 11cm H2O.    . PVC's (premature ventricular contractions)     Current Outpatient Medications  Medication Sig Dispense Refill  . aspirin 81 MG EC tablet Take 1 tablet (81 mg total) by mouth daily. 30 tablet 11  . carvedilol (COREG) 6.25 MG tablet Take 1 tablet (6.25 mg total) by mouth 2 (two) times daily with a meal. 180 tablet 1  . co-enzyme Q-10 30 MG capsule Take 30 mg by mouth daily.    . furosemide (LASIX) 20 MG tablet Take 20 mg by mouth.    Marland Kitchen HYDROcodone-acetaminophen (NORCO/VICODIN) 5-325 MG tablet Take 1-2 tablets by mouth every 6 (six) hours as needed. 8 tablet 0  . losartan (COZAAR) 25 MG tablet Take 1 tablet (25 mg total) by mouth 2 (two) times daily. 180 tablet 3  . rosuvastatin (CRESTOR) 20 MG tablet Take  20 mg by mouth at bedtime.  3  . spironolactone (ALDACTONE) 25 MG tablet Take 1 tablet (25 mg total) by mouth daily. 90 tablet 1  . nitroGLYCERIN (NITROSTAT) 0.4 MG SL tablet Place 1 tablet (0.4 mg total) under the tongue every 5 (five) minutes x 3 doses as needed for chest pain. (Patient not taking: Reported on 07/10/2018) 25 tablet 12   No current facility-administered medications for this encounter.     Allergies  Allergen Reactions  . Aspirin Other (See Comments)    "stomach irritation", GI bleeding, also mentions colon CA      Social History    Socioeconomic History  . Marital status: Married    Spouse name: Not on file  . Number of children: Not on file  . Years of education: Not on file  . Highest education level: Not on file  Occupational History  . Not on file  Social Needs  . Financial resource strain: Not on file  . Food insecurity:    Worry: Not on file    Inability: Not on file  . Transportation needs:    Medical: Not on file    Non-medical: Not on file  Tobacco Use  . Smoking status: Never Smoker  . Smokeless tobacco: Never Used  Substance and Sexual Activity  . Alcohol use: No  . Drug use: No  . Sexual activity: Never  Lifestyle  . Physical activity:    Days per week: Not on file    Minutes per session: Not on file  . Stress: Not on file  Relationships  . Social connections:    Talks on phone: Not on file    Gets together: Not on file    Attends religious service: Not on file    Active member of club or organization: Not on file    Attends meetings of clubs or organizations: Not on file    Relationship status: Not on file  . Intimate partner violence:    Fear of current or ex partner: Not on file    Emotionally abused: Not on file    Physically abused: Not on file    Forced sexual activity: Not on file  Other Topics Concern  . Not on file  Social History Narrative  . Not on file      Family History  Problem Relation Age of Onset  . Colon cancer Mother   . Deep vein thrombosis Father   . Heart disease Brother   . Heart attack Brother     Vitals:   08/07/18 1126  BP: (!) 138/92  Pulse: 76  SpO2: 96%  Weight: 96.2 kg (212 lb)   Wt Readings from Last 3 Encounters:  08/07/18 96.2 kg (212 lb)  07/30/18 97.1 kg (214 lb)  07/10/18 96.2 kg (212 lb)    PHYSICAL EXAM: General:  Well appearing. No resp difficulty HEENT: normal Neck: supple. no JVD. Carotids 2+ bilat; no bruits. No lymphadenopathy or thryomegaly appreciated. Cor: PMI nondisplaced. Regular rate & rhythm. No rubs,  gallops or murmurs. Lungs: clear Abdomen: soft, nontender, nondistended. No hepatosplenomegaly. No bruits or masses. Good bowel sounds. Extremities: no cyanosis, clubbing, rash, edema Neuro: alert & orientedx3, cranial nerves grossly intact. moves all 4 extremities w/o difficulty. Affect pleasant  EKG: NSR 76 bpm no PVCs  ASSESSMENT & PLAN:  1. Chronic systolic HF - due to NICM -> likely PVC-mediated +/- HTN or OSA. EF  20-25%. Cath 5/18. Non-obstructive CAD - cMRI 7/18 with LVEF 20%, normal RV,  and no LGE - echo 11/18 EF 30-35%. No ICD yet as EF improving.  - Echo 11/2017 EF 45% improving with PVC suppression. Now off amiodarone and on mexilitine.  - NYHA II. Volume status stable. Does not need lasix.  - Continue spironolactone 25 daily.  - Continue losartan to 25 mg BID. Did not tolerate Entresto 24/26 with hypotension. Will not retry with improvement in EF. - Continue carvedilol 6.25 bid  2. CAD, nonobstructive - Continue ASA. Off crestor with muscle weakness that resolved after stopping.   - no s/s ischemia  3. OSA - Using periodically . Reinforced compliance.   4. PVCs - Burden > 10% on amio via Holter monitor 02/18/17.  - Holter 10/18 PVC burden 5%.  - Follows with Dr Curt Bears. Amio stopped in July by Dr Curt Bears.  - Repeat 48 hour monitor 04/2018 showed 15% PVCs.  - Intolerant mexilitine due to nausea in November.  - No PVCs on EKG.  - He will follow up with Dr Curt Bears in few months.   5. HTN Stable. BP stable at home. Will not increase meds.   6. Recent AKI Creatinine baseline 1.6-1.9 Has urology for follow later this week for stone removal.    Follow up in 3 months with Dr Haroldine Laws    Darrick Grinder, NP  11:38 AM

## 2018-08-21 ENCOUNTER — Encounter (HOSPITAL_COMMUNITY): Payer: Self-pay | Admitting: Emergency Medicine

## 2018-08-21 ENCOUNTER — Emergency Department (HOSPITAL_COMMUNITY): Payer: Medicare HMO

## 2018-08-21 ENCOUNTER — Emergency Department (HOSPITAL_COMMUNITY)
Admission: EM | Admit: 2018-08-21 | Discharge: 2018-08-21 | Disposition: A | Discharge status: Home / Self Care | Payer: Medicare HMO | Attending: Emergency Medicine | Admitting: Emergency Medicine

## 2018-08-21 DIAGNOSIS — R55 Syncope and collapse: Secondary | ICD-10-CM | POA: Diagnosis not present

## 2018-08-21 DIAGNOSIS — N3000 Acute cystitis without hematuria: Secondary | ICD-10-CM

## 2018-08-21 DIAGNOSIS — I5042 Chronic combined systolic (congestive) and diastolic (congestive) heart failure: Secondary | ICD-10-CM | POA: Diagnosis not present

## 2018-08-21 DIAGNOSIS — R42 Dizziness and giddiness: Secondary | ICD-10-CM | POA: Diagnosis present

## 2018-08-21 DIAGNOSIS — I110 Hypertensive heart disease with heart failure: Secondary | ICD-10-CM | POA: Diagnosis not present

## 2018-08-21 LAB — URINALYSIS, ROUTINE W REFLEX MICROSCOPIC
Bilirubin Urine: NEGATIVE
Glucose, UA: NEGATIVE mg/dL
Ketones, ur: NEGATIVE mg/dL
Nitrite: NEGATIVE
Protein, ur: NEGATIVE mg/dL
Specific Gravity, Urine: 1.012 (ref 1.005–1.030)
pH: 5 (ref 5.0–8.0)

## 2018-08-21 LAB — CBC WITH DIFFERENTIAL/PLATELET
Abs Immature Granulocytes: 0.36 10*3/uL — ABNORMAL HIGH (ref 0.00–0.07)
Basophils Absolute: 0.1 10*3/uL (ref 0.0–0.1)
Basophils Relative: 0 %
Eosinophils Absolute: 0 10*3/uL (ref 0.0–0.5)
Eosinophils Relative: 0 %
HCT: 40.5 % (ref 39.0–52.0)
Hemoglobin: 13.2 g/dL (ref 13.0–17.0)
Immature Granulocytes: 1 %
Lymphocytes Relative: 4 %
Lymphs Abs: 1 10*3/uL (ref 0.7–4.0)
MCH: 30.2 pg (ref 26.0–34.0)
MCHC: 32.6 g/dL (ref 30.0–36.0)
MCV: 92.7 fL (ref 80.0–100.0)
Monocytes Absolute: 2.4 10*3/uL — ABNORMAL HIGH (ref 0.1–1.0)
Monocytes Relative: 9 %
Neutro Abs: 22.3 10*3/uL — ABNORMAL HIGH (ref 1.7–7.7)
Neutrophils Relative %: 86 %
Platelets: 249 10*3/uL (ref 150–400)
RBC: 4.37 MIL/uL (ref 4.22–5.81)
RDW: 11.9 % (ref 11.5–15.5)
WBC: 26.2 10*3/uL — ABNORMAL HIGH (ref 4.0–10.5)
nRBC: 0 % (ref 0.0–0.2)

## 2018-08-21 LAB — COMPREHENSIVE METABOLIC PANEL
ALT: 25 U/L (ref 0–44)
AST: 25 U/L (ref 15–41)
Albumin: 3.2 g/dL — ABNORMAL LOW (ref 3.5–5.0)
Alkaline Phosphatase: 47 U/L (ref 38–126)
Anion gap: 9 (ref 5–15)
BUN: 15 mg/dL (ref 8–23)
CO2: 19 mmol/L — ABNORMAL LOW (ref 22–32)
Calcium: 8.3 mg/dL — ABNORMAL LOW (ref 8.9–10.3)
Chloride: 111 mmol/L (ref 98–111)
Creatinine, Ser: 1.95 mg/dL — ABNORMAL HIGH (ref 0.61–1.24)
GFR calc Af Amer: 40 mL/min — ABNORMAL LOW (ref >60)
GFR calc non Af Amer: 35 mL/min — ABNORMAL LOW (ref >60)
Glucose, Bld: 90 mg/dL (ref 70–99)
Potassium: 3.8 mmol/L (ref 3.5–5.1)
Sodium: 139 mmol/L (ref 135–145)
Total Bilirubin: 1.3 mg/dL — ABNORMAL HIGH (ref 0.3–1.2)
Total Protein: 6.2 g/dL — ABNORMAL LOW (ref 6.5–8.1)

## 2018-08-21 LAB — LACTIC ACID, PLASMA: Lactic Acid, Venous: 1.7 mmol/L (ref 0.5–1.9)

## 2018-08-21 LAB — MAGNESIUM: Magnesium: 1.5 mg/dL — ABNORMAL LOW (ref 1.7–2.4)

## 2018-08-21 LAB — I-STAT TROPONIN, ED: Troponin i, poc: 0.03 ng/mL (ref 0.00–0.08)

## 2018-08-21 LAB — CBG MONITORING, ED: Glucose-Capillary: 74 mg/dL (ref 70–99)

## 2018-08-21 MED ORDER — POTASSIUM CHLORIDE CRYS ER 20 MEQ PO TBCR
40.0000 meq | EXTENDED_RELEASE_TABLET | Freq: Once | ORAL | Status: AC
  Administered 2018-08-21: 40 meq via ORAL
  Filled 2018-08-21: qty 2

## 2018-08-21 MED ORDER — MAGNESIUM HYDROXIDE 400 MG/5ML PO SUSP: 5.0000 mL | Freq: Every evening | ORAL | 0 refills | Status: DC

## 2018-08-21 MED ORDER — CEPHALEXIN 500 MG PO CAPS: 500.0000 mg | ORAL_CAPSULE | Freq: Four times a day (QID) | ORAL | 0 refills | Status: AC

## 2018-08-21 MED ORDER — CEPHALEXIN 250 MG PO CAPS
500.0000 mg | ORAL_CAPSULE | Freq: Once | ORAL | Status: AC
  Administered 2018-08-21: 500 mg via ORAL
  Filled 2018-08-21: qty 2

## 2018-08-21 MED ORDER — MAGNESIUM SULFATE 2 GM/50ML IV SOLN
2.0000 g | Freq: Once | INTRAVENOUS | Status: AC
  Administered 2018-08-21: 2 g via INTRAVENOUS
  Filled 2018-08-21: qty 50

## 2018-08-21 MED ORDER — ACETAMINOPHEN 500 MG PO TABS
1000.0000 mg | ORAL_TABLET | Freq: Once | ORAL | Status: AC
  Administered 2018-08-21: 1000 mg via ORAL
  Filled 2018-08-21: qty 2

## 2018-08-21 MED ORDER — CEPHALEXIN 250 MG PO CAPS: 500.0000 mg | ORAL_CAPSULE | Freq: Once | ORAL | Status: DC

## 2018-08-21 MED ORDER — LACTATED RINGERS IV BOLUS
1000.0000 mL | Freq: Once | INTRAVENOUS | Status: AC
  Administered 2018-08-21: 1000 mL via INTRAVENOUS

## 2018-08-21 NOTE — ED Notes (Signed)
Patient transported to X-ray 

## 2018-08-21 NOTE — ED Triage Notes (Signed)
Pt arrives via EMS from [facility] center with reports of dizziness for a week. Wife witnessed AMS and incontinence, and staff reports pt was shaking all over. CBG 131

## 2018-08-21 NOTE — ED Provider Notes (Signed)
Emergency Department Provider Note   I have reviewed the triage vital signs and the nursing notes.   HISTORY  Chief Complaint Dizziness   HPI David Underwood is a 68 y.o. male with multiple medical problems as described below the presents to the emergency department today secondary to sounds like syncopal episode.  Patient states he had chills for about 24 hours ago without any other symptoms but is been having dizziness, that he describes as feeling like he is going to pass out, for the next 12 hours and she describes lightheadedness.  He states he had decreased appetite over the last week or 2 and has not been eating or drinking as much and is probably lost about 20 pounds during that time.  He states that he has little bit of a headache today.  They went to the doctor's office because of the chills and while he was there he had a sudden loss of consciousness without any prequel.  The wife states that he was probably unconscious for approximately 2 minutes and had some intermittent episodes of allover twitching and one episode of urinary incontinence.  Patient woke up and within a few minutes was back to himself and EMS was called and reportedly had a normal blood sugar was brought here for further evaluation.  Patient states his blood pressures are usually in the 150-160s so he thinks that 100s is actually kind of low for him.  Is no rashes or no urinary symptoms.  He has an ostomy but has no increased output from his ostomy.  No nausea or vomiting just decreased appetite.  No abdominal pain.  Has not checked his fevers at home. No other associated or modifying symptoms.    Past Medical History:  Diagnosis Date  . Back pain   . CHF (congestive heart failure) ([place])   . Chronic combined systolic and diastolic heart failure ([place]) 12/24/2016  . Colon cancer [facility])    s/p colostomy  . GERD (gastroesophageal reflux disease)    with associated esophagitis on PPI  . Hyperlipidemia   .  Hypertension   . Junctional rhythm   . Myocardial infarction ([place])   . NICM (nonischemic cardiomyopathy) ([place])   . Nonobstructive atherosclerosis of coronary artery    a. 12/21/16- Cath: non-obstructive CAD, EF <25%.  . Orthostatic hypotension   . OSA on CPAP 10/26/2017   Moderate with AHI 28/hr now on CPAP at 11cm H2O.    . PVC's (premature ventricular contractions)     Patient Active Problem List   Diagnosis Date Noted  . OSA on CPAP 10/26/2017  . PVC's (premature ventricular contractions) 12/27/2016  . Benign essential HTN 12/27/2016  . Chronic combined systolic and diastolic heart failure ([place]) 12/24/2016  . NICM (nonischemic cardiomyopathy) ([place]) 12/24/2016  . Syncope and collapse 12/23/2016  . Coronary artery disease, non-occlusive with cath 12/21/16 12/22/2016  . Pure hypercholesterolemia     Past Surgical History:  Procedure Laterality Date  . COLON SURGERY    . LEFT HEART CATH AND CORONARY ANGIOGRAPHY N/A 12/21/2016   Procedure: Left Heart Cath and Coronary Angiography;  Surgeon: Martinique, Peter M, MD;  Location: [facility];  Service: Cardiovascular;  Laterality: N/A;    Current Outpatient Rx  . Order #: [id number] Class: Normal  . Order #: [id number] Class: Normal  . Order #: [id number] Class: Print  . Order #: [id number] Class: Historical Med  . Order #: [id number] Class: Historical Med  . Order #: [id number] Class: Print  . Order #: [id number] Class: Normal  .  Order #: [id number] Class: Print  . Order #: [id number] Class: Normal  . Order #: [id number] Class: Historical Med  . Order #: [id number] Class: Normal    Allergies Aspirin  Family History  Problem Relation Age of Onset  . Colon cancer Mother   . Deep vein thrombosis Father   . Heart disease Brother   . Heart attack Brother     Social History Social History   Tobacco Use  . Smoking status: Never Smoker  . Smokeless tobacco: Never Used  Substance Use Topics  . Alcohol use: No  . Drug use: No    Review of  Systems  All other systems negative except as documented in the HPI. All pertinent positives and negatives as reviewed in the HPI. ____________________________________________   PHYSICAL EXAM:  VITAL SIGNS: ED Triage Vitals  Enc Vitals Group     BP 08/21/18 1438 133/64     Pulse Rate 08/21/18 1438 79     Resp 08/21/18 1438 17     Temp 08/21/18 1438 98.9 F (37.2 C)     Temp Source 08/21/18 1438 Oral     SpO2 08/21/18 1428 98 %    Constitutional: Alert and oriented. Well appearing and in no acute distress. Eyes: Conjunctivae are normal. PERRL. EOMI. Head: Atraumatic. Nose: No congestion/rhinnorhea. Mouth/Throat: Mucous membranes are moist.  Oropharynx non-erythematous. Neck: No stridor.  No meningeal signs.   Cardiovascular: Normal rate, regular rhythm. Good peripheral circulation. Grossly normal heart sounds.   Respiratory: Normal respiratory effort.  No retractions. Lungs CTAB. Gastrointestinal: Soft and nontender. No distention.  Musculoskeletal: No lower extremity tenderness nor edema. No gross deformities of extremities. Neurologic:  Normal speech and language. No gross focal neurologic deficits are appreciated.  Skin:  Skin is warm, dry and intact. No rash noted.   ____________________________________________   LABS (all labs ordered are listed, but only abnormal results are displayed)  Labs Reviewed  CBC WITH DIFFERENTIAL/PLATELET - Abnormal; Notable for the following components:      Result Value   WBC 26.2 (*)    Neutro Abs 22.3 (*)    Monocytes Absolute 2.4 (*)    Abs Immature Granulocytes 0.36 (*)    All other components within normal limits  COMPREHENSIVE METABOLIC PANEL - Abnormal; Notable for the following components:   CO2 19 (*)    Creatinine, Ser 1.95 (*)    Calcium 8.3 (*)    Total Protein 6.2 (*)    Albumin 3.2 (*)    Total Bilirubin 1.3 (*)    GFR calc non Af Amer 35 (*)    GFR calc Af Amer 40 (*)    All other components within normal limits    URINALYSIS, ROUTINE W REFLEX MICROSCOPIC - Abnormal; Notable for the following components:   Hgb urine dipstick LARGE (*)    Leukocytes, UA LARGE (*)    Bacteria, UA RARE (*)    All other components within normal limits  MAGNESIUM - Abnormal; Notable for the following components:   Magnesium 1.5 (*)    All other components within normal limits  URINE CULTURE  LACTIC ACID, PLASMA  CBG MONITORING, ED  I-STAT TROPONIN, ED   ____________________________________________  EKG   EKG Interpretation  Date/Time:  Monday August 21 2018 14:36:52 EST Ventricular Rate:  85 PR Interval:    QRS Duration: 119 QT Interval:  444 QTC Calculation: 528 R Axis:   45 Text Interpretation:  Sinus rhythm Multiple ventricular premature complexes Probable left atrial enlargement Nonspecific intraventricular conduction delay Probable anterior  infarct, age indeterminate Lateral leads are also involved Baseline wander in lead(s) II III aVR aVF V1 prolonged qtc ST changes similar to 1/13 Confirmed by Merrily Pew [phone]) on 08/21/2018 3:10:45 PM       ____________________________________________  RADIOLOGY  No results found.  ____________________________________________   INITIAL IMPRESSION / ASSESSMENT AND PLAN / ED COURSE  Near syncopal episodes and one episode of syncope at physicians office, however with incontinence it could have been seizure as well. Feels warm, consider possible infectious.   Likely UTI w/ low Mg. Improved symptoms. Back at baseline plan for dc w/ pcp follow up.  Pertinent labs & imaging results that were available during my care of the patient were reviewed by me and considered in my medical decision making (see chart for details).  ____________________________________________  FINAL CLINICAL IMPRESSION(S) / ED DIAGNOSES  Final diagnoses:  Hypomagnesemia  Acute cystitis without hematuria  Syncope, unspecified syncope type     MEDICATIONS GIVEN DURING THIS  VISIT:  Medications  lactated ringers bolus 1,000 mL (0 mLs Intravenous Stopped 08/21/18 1719)  acetaminophen (TYLENOL) tablet 1,000 mg (1,000 mg Oral Given 08/21/18 1547)  magnesium sulfate IVPB 2 g 50 mL (0 g Intravenous Stopped 08/21/18 1836)  potassium chloride SA (K-DUR,KLOR-CON) CR tablet 40 mEq (40 mEq Oral Given 08/21/18 1733)  cephALEXin (KEFLEX) capsule 500 mg (500 mg Oral Given 08/21/18 1733)     NEW OUTPATIENT MEDICATIONS STARTED DURING THIS VISIT:  Discharge Medication List as of 08/21/2018  7:00 PM    START taking these medications   Details  cephALEXin (KEFLEX) 500 MG capsule Take 1 capsule (500 mg total) by mouth 4 (four) times daily., Starting Mon 08/21/2018, Print    magnesium hydroxide (MILK OF MAGNESIA) 400 MG/5ML suspension Take 5 mLs by mouth Nightly., Starting Mon 08/21/2018, Print        Note:  This note was prepared with assistance of Dragon voice recognition software. Occasional wrong-word or sound-a-like substitutions may have occurred due to the inherent limitations of voice recognition software.   Ramirez Fullbright, Corene Cornea, MD 08/23/18 [phone]

## 2018-08-21 NOTE — ED Notes (Signed)
Pt verbalized understanding of discharge paperwork, prescriptions and follow-up care 

## 2018-08-21 NOTE — ED Notes (Signed)
Pt ambulated to bathroom 

## 2018-08-23 LAB — URINE CULTURE

## 2018-09-07 ENCOUNTER — Other Ambulatory Visit: Payer: Self-pay | Admitting: *Deleted

## 2018-09-07 DIAGNOSIS — I493 Ventricular premature depolarization: Secondary | ICD-10-CM

## 2018-09-12 IMAGING — US US ABDOMEN LIMITED
1 series · 14 of 25 positions shown · non-contrast
Comparison: 12/20/2017

CLINICAL DATA: Right upper quadrant pain and nausea and vomiting

EXAM:
ULTRASOUND ABDOMEN LIMITED RIGHT UPPER QUADRANT

[Series 1: us abdomen limited · 0.24mm/px · 14 of 61 slices shown]
[im 1/61]
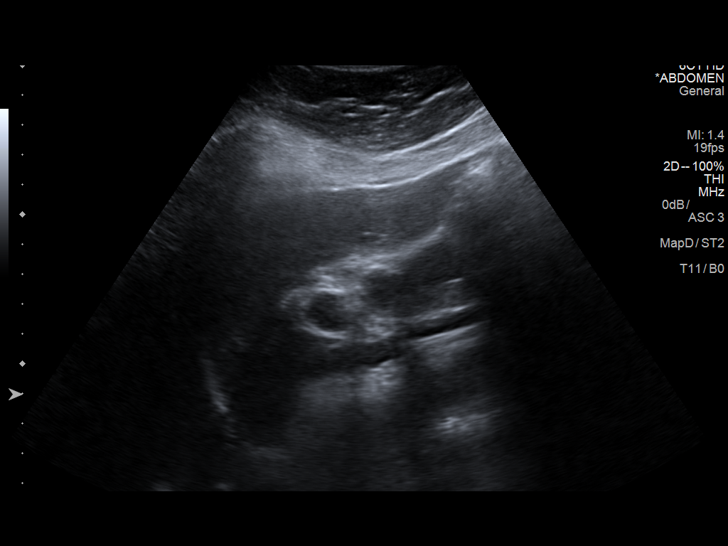
[im 6/61]
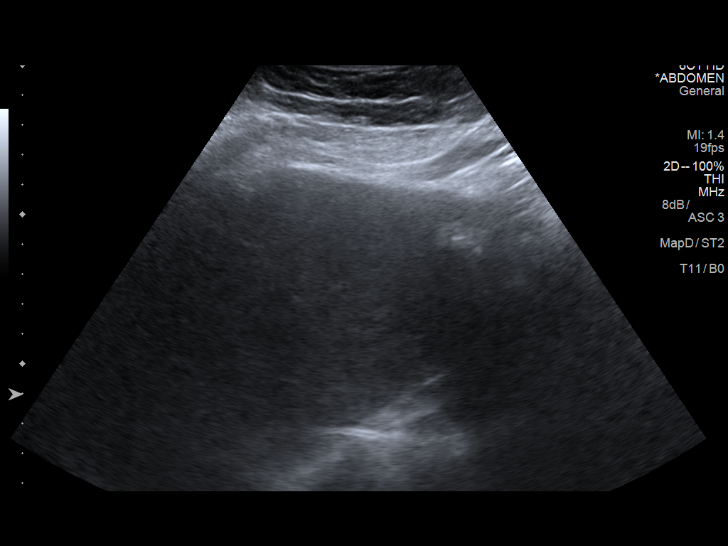
[im 11/61]
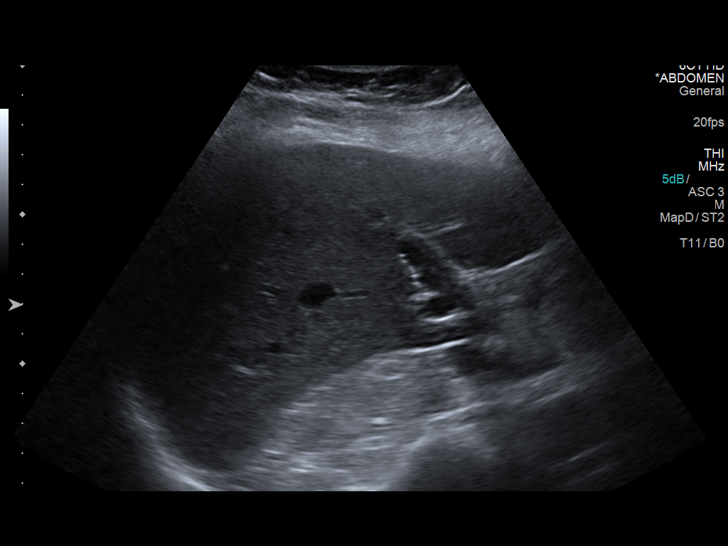
[im 16/61]
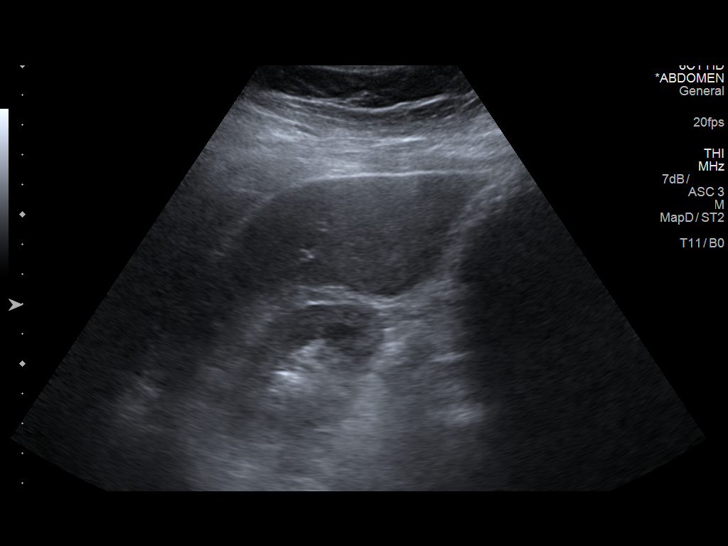
[im 21/61]
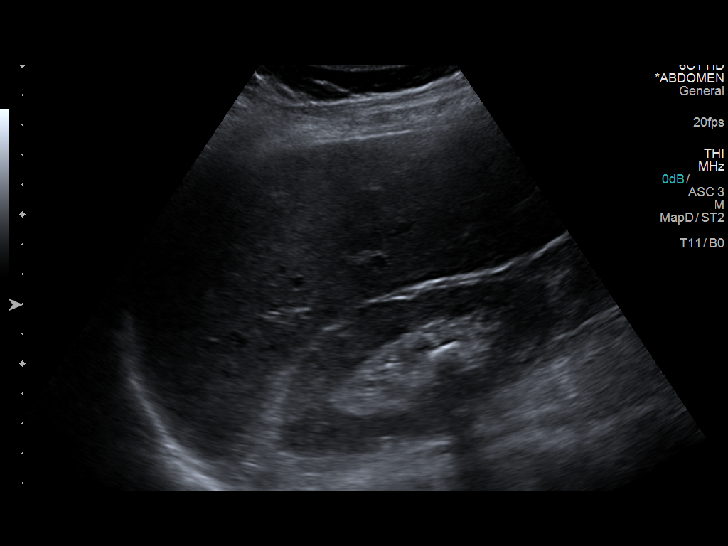
[im 23/61]
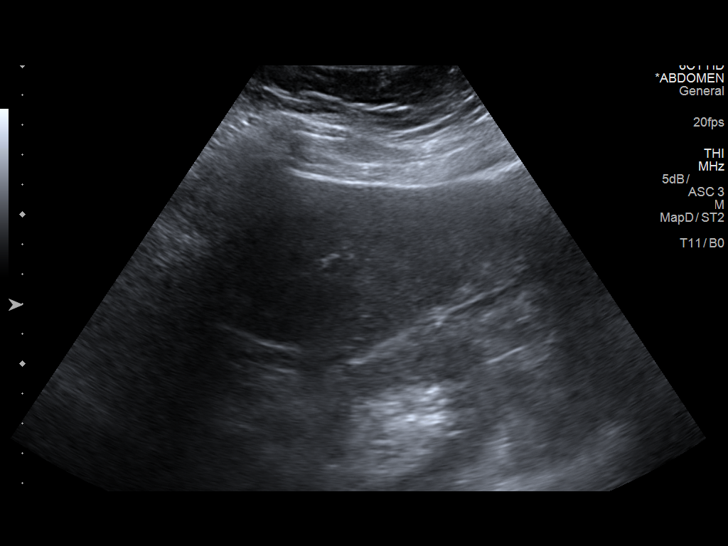
[im 28/61]
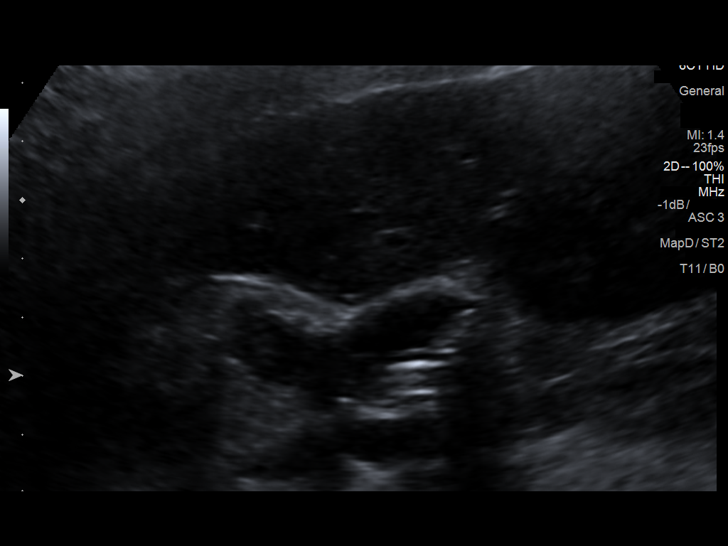
[im 33/61]
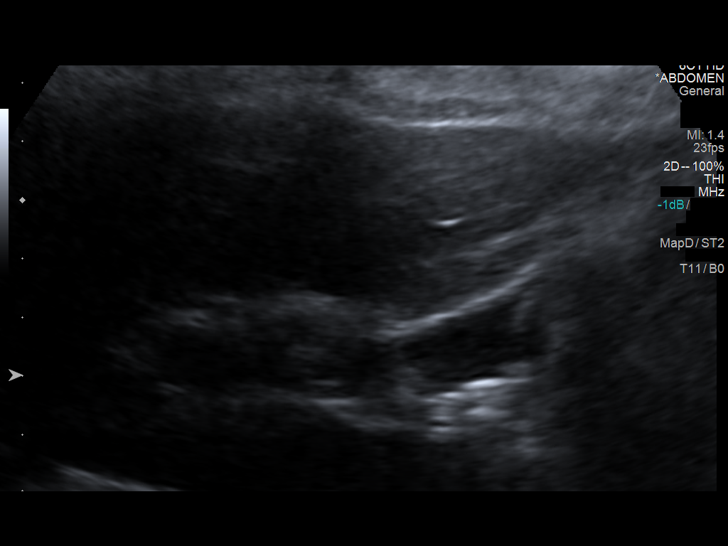
[im 38/61]
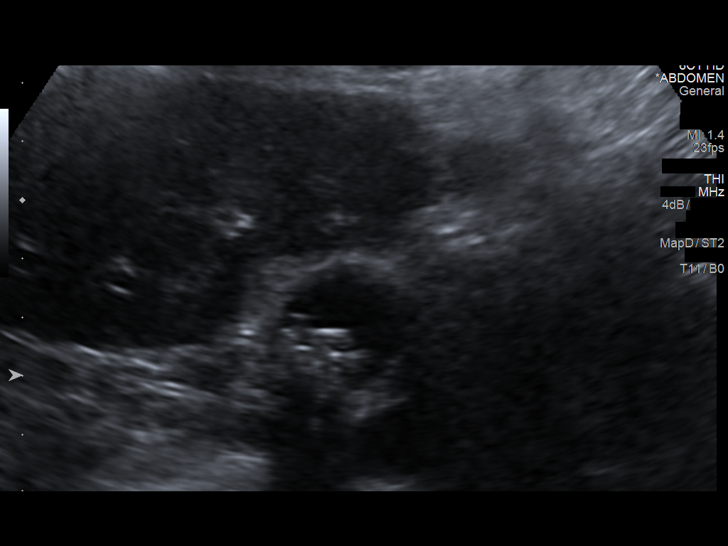
[im 41/61]
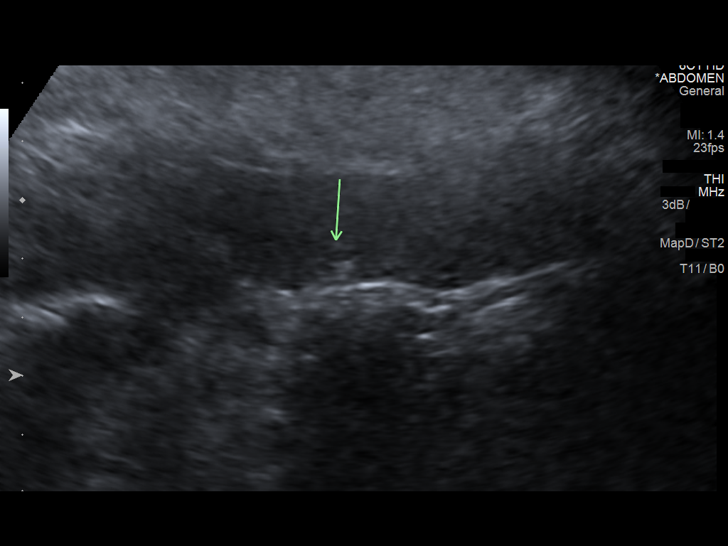
[im 46/61]
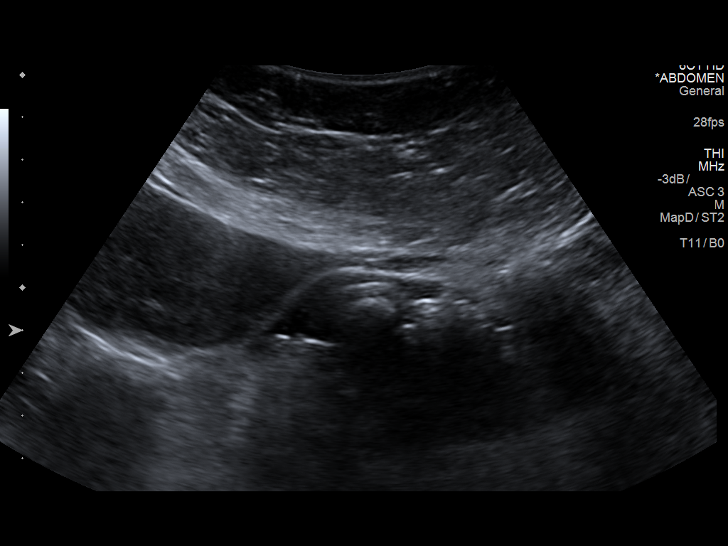
[im 51/61]
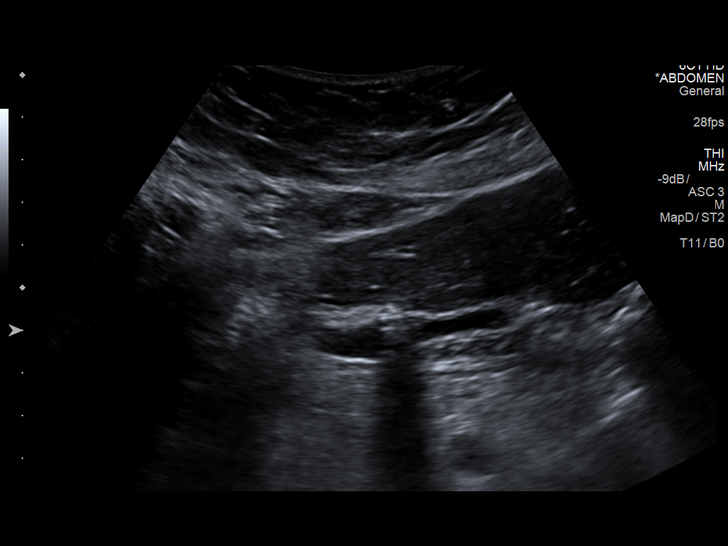
[im 56/61]
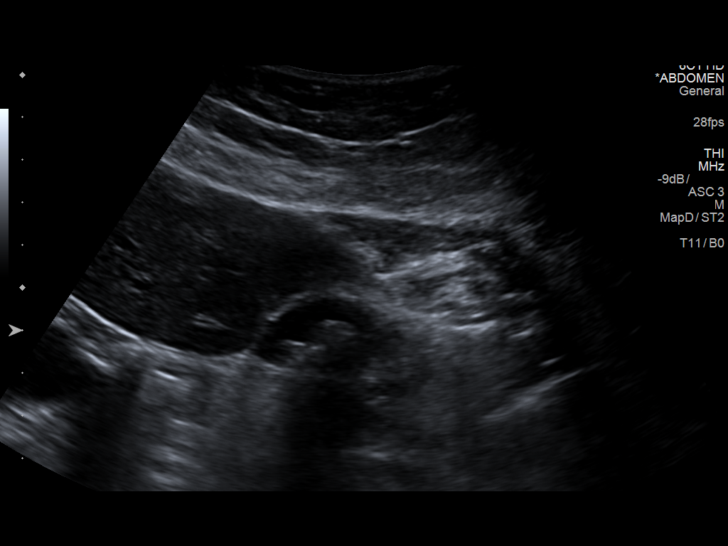
[im 61/61]
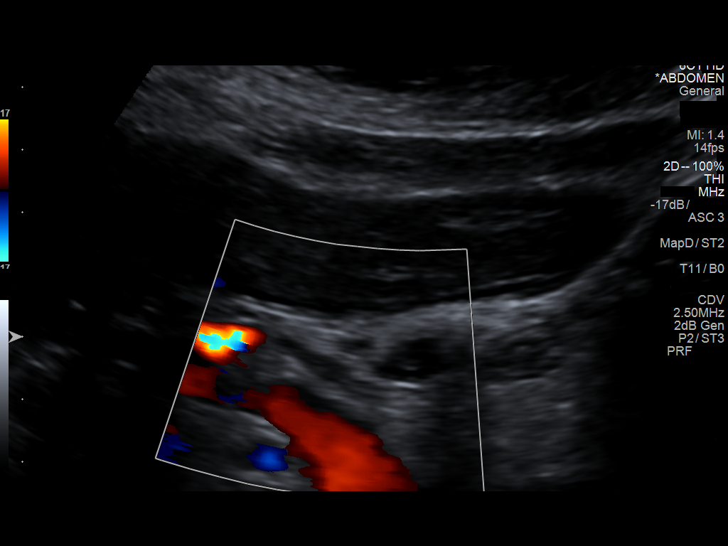

[14 of 25 positions shown; findings below may reference images not displayed]

FINDINGS: Gallbladder:

Multiple gallstones are identified. No gallbladder wall thickening
or pericholecystic fluid is noted. Stones are noted in the region of
the gallbladder neck.

Common bile duct:

Diameter: 3.5 mm

Liver:

No focal lesion identified. Within normal limits in parenchymal
echogenicity. Portal vein is patent on color Doppler imaging with
normal direction of blood flow towards the liver.
IMPRESSION: Multiple gallstones without gallbladder wall thickening or
pericholecystic fluid.

## 2018-09-25 ENCOUNTER — Ambulatory Visit (INDEPENDENT_AMBULATORY_CARE_PROVIDER_SITE_OTHER): Payer: Medicare HMO

## 2018-09-25 ENCOUNTER — Encounter (INDEPENDENT_AMBULATORY_CARE_PROVIDER_SITE_OTHER): Payer: Self-pay

## 2018-09-25 DIAGNOSIS — I493 Ventricular premature depolarization: Secondary | ICD-10-CM

## 2018-09-29 ENCOUNTER — Encounter: Payer: Self-pay | Admitting: Cardiology

## 2018-09-29 ENCOUNTER — Ambulatory Visit: Payer: Medicare HMO | Admitting: Cardiology

## 2018-09-29 VITALS — BP 152/94 | HR 66 | Ht 71.0 in | Wt 215.8 lb

## 2018-09-29 DIAGNOSIS — I493 Ventricular premature depolarization: Secondary | ICD-10-CM | POA: Diagnosis not present

## 2018-09-29 NOTE — Patient Instructions (Signed)
Medication Instructions:  Your physician recommends that you continue on your current medications as directed. Please refer to the Current Medication list given to you today.  If you need a refill on your cardiac medications before your next appointment, please call your pharmacy.   Labwork: None ordered  Testing/Procedures: None ordered  Follow-Up: Your physician wants you to follow-up in: 6 months with Dr. Camnitz.  You will receive a reminder letter in the mail two months in advance. If you don't receive a letter, please call our office to schedule the follow-up appointment.  Thank you for choosing [facility]!!   Ellesse Antenucci, RN [phone]         

## 2018-09-29 NOTE — Progress Notes (Signed)
Electrophysiology Office Note   Date:  09/29/2018   ID:  Underwood, David 04-05-1951, MRN [id number]  PCP:  [place]  Cardiologist:  Radford Pax Primary Electrophysiologist:  Shawnte Winton Meredith Leeds, MD    No chief complaint on file.    History of Present Illness: David Underwood is a 68 y.o. male who is being seen today for the evaluation of CHF, PVCs at the request of Center[facility] Presenting today for electrophysiology evaluation. He has a history of GERD, colon cancer status post colostomy, hypertension, CKG stage III, recently diagnosed nonischemic cardiomyopathy, nonobstructive coronary disease, PVCs, orthostatic hypotension, and junctional rhythm. Echo 11/2016 showed an EF of 20-25%.   He was initially on both beta-blockers and Entresto but these were stopped due to bradycardia and orthostatic hypotension.  Repeat cardiac monitor after initiation of amiodarone showed an improved  PVC burden at 5%.  An ICD was not implanted as his ejection fraction had improved.  He was started on losartan and low-dose carvedilol which he has been tolerating.  He has since been diagnosed with sleep apnea.  He wears CPAP.  Repeat echo showed an EF of 40 to 45%.  He was taken off of amiodarone and his PVC burden went back up to 15%.  Today, denies symptoms of palpitations, chest pain, shortness of breath, orthopnea, PND, lower extremity edema, claudication, dizziness, presyncope, syncope, bleeding, or neurologic sequela. The patient is tolerating medications without difficulties.  Overall he is doing well.  He has no chest pain or shortness of breath.  He is unaware of PVCs.  Past Medical History:  Diagnosis Date  . Back pain   . CHF (congestive heart failure) ([place])   . Chronic combined systolic and diastolic heart failure ([place]) 12/24/2016  . Colon cancer [facility])    s/p colostomy  . GERD (gastroesophageal reflux disease)    with associated esophagitis on PPI  . Hyperlipidemia    . Hypertension   . Junctional rhythm   . Myocardial infarction ([place])   . NICM (nonischemic cardiomyopathy) ([place])   . Nonobstructive atherosclerosis of coronary artery    a. 12/21/16- Cath: non-obstructive CAD, EF <25%.  . Orthostatic hypotension   . OSA on CPAP 10/26/2017   Moderate with AHI 28/hr now on CPAP at 11cm H2O.    . PVC's (premature ventricular contractions)    Past Surgical History:  Procedure Laterality Date  . COLON SURGERY    . LEFT HEART CATH AND CORONARY ANGIOGRAPHY N/A 12/21/2016   Procedure: Left Heart Cath and Coronary Angiography;  Surgeon: Martinique, Peter M, MD;  Location: [place] [facility];  Service: Cardiovascular;  Laterality: N/A;     Current Outpatient Medications  Medication Sig Dispense Refill  . aspirin 81 MG EC tablet Take 1 tablet (81 mg total) by mouth daily. 30 tablet 11  . cephALEXin (KEFLEX) 500 MG capsule Take 1 capsule (500 mg total) by mouth 4 (four) times daily. 28 capsule 0  . co-enzyme Q-10 30 MG capsule Take 30 mg by mouth daily.    . furosemide (LASIX) 20 MG tablet Take 20 mg by mouth.    . losartan (COZAAR) 25 MG tablet Take 1 tablet (25 mg total) by mouth 2 (two) times daily. 180 tablet 3  . nitroGLYCERIN (NITROSTAT) 0.4 MG SL tablet Place 1 tablet (0.4 mg total) under the tongue every 5 (five) minutes x 3 doses as needed for chest pain. 25 tablet 12  . rosuvastatin (CRESTOR) 20 MG tablet Take 20 mg by  mouth at bedtime.  3  . spironolactone (ALDACTONE) 25 MG tablet Take 1 tablet (25 mg total) by mouth daily. 90 tablet 1  . carvedilol (COREG) 6.25 MG tablet Take 1 tablet (6.25 mg total) by mouth 2 (two) times daily with a meal. 180 tablet 1   No current facility-administered medications for this visit.     Allergies:   Aspirin   Social History:  The patient  reports that he has never smoked. He has never used smokeless tobacco. He reports that he does not drink alcohol or use drugs.   Family History:  The patient's family history  includes Colon cancer in his mother; Deep vein thrombosis in his father; Heart attack in his brother; Heart disease in his brother.   ROS:  Please see the history of present illness.   Otherwise, review of systems is positive for potation's, snoring.   All other systems are reviewed and negative.   PHYSICAL EXAM: VS:  BP (!) 152/94   Pulse 66   Ht 5\' 11"  (1.803 m)   Wt 215 lb 12.8 oz (97.9 kg)   SpO2 96%   BMI 30.10 kg/m  , BMI Body mass index is 30.1 kg/m. GEN: Well nourished, well developed, in no acute distress  HEENT: normal  Neck: no JVD, carotid bruits, or masses Cardiac: RRR; no murmurs, rubs, or gallops,no edema  Respiratory:  clear to auscultation bilaterally, normal work of breathing GI: soft, nontender, nondistended, + BS MS: no deformity or atrophy  Skin: warm and dry Neuro:  Strength and sensation are intact Psych: euthymic mood, full affect  EKG:  EKG is ordered today. Personal review of the ekg ordered shows this rhythm, rate 66, lateral T wave inversions   Recent Labs: 11/15/2017: TSH 1.650 08/21/2018: ALT 25; BUN 15; Creatinine, Ser 1.95; Hemoglobin 13.2; Magnesium 1.5; Platelets 249; Potassium 3.8; Sodium 139    Lipid Panel     Component Value Date/Time   CHOL 119 02/08/2017 0902   TRIG 61 02/08/2017 0902   HDL 54 02/08/2017 0902   CHOLHDL 2.2 02/08/2017 0902   CHOLHDL 3.5 12/20/2016 0454   VLDL 11 12/20/2016 0454   LDLCALC 53 02/08/2017 0902     Wt Readings from Last 3 Encounters:  09/29/18 215 lb 12.8 oz (97.9 kg)  08/07/18 212 lb (96.2 kg)  07/30/18 214 lb (97.1 kg)      Other studies Reviewed: Additional studies/ records that were reviewed today include: TTE 12/16/17 Review of the above records today demonstrates:  - Left ventricle: The cavity size was normal. Wall thickness was   increased in a pattern of mild LVH. Systolic function was mildly   to moderately reduced. The estimated ejection fraction was in the   range of 40% to 45%.  Diffuse hypokinesis. There was no evidence   of elevated ventricular filling pressure by Doppler parameters. - Aortic valve: There was trivial regurgitation. - Mitral valve: There was mild regurgitation. - Right ventricle: The cavity size was mildly dilated. Wall   thickness was normal.  Cath 12/21/16  Mid RCA lesion, 30 %stenosed.  RPDA-2 lesion, 50 %stenosed.  RPDA-1 lesion, 50 %stenosed.  Prox LAD to Mid LAD lesion, 30 %stenosed.  Ost 2nd Mrg to 2nd Mrg lesion, 15 %stenosed.  There is severe left ventricular systolic dysfunction.  LV end diastolic pressure is normal.  The left ventricular ejection fraction is less than 25% by visual estimate.   1. Nonobstructive CAD 2. Severe LV dysfunction 3. Normal LVEDP  Holter  02/22/17 - personally reviewed  Normal sinus rhythm and sinus tachycardia. The average heart rate was 72bpm and the heart rate ranged from 45-119bpm.  Frequent PVCs, ventricular couplets, ventricular bigeminy, multifocal ventricular salvos.  PVC load was 10%  CMRI 02/09/17 1. Mildly dilated LV with mild concentric LVH. Diffuse hypokinesis with EF 20%.  2.  Normal RV size and systolic function.  3. No myocardial LGE, so no definitive evidence for prior MI, infiltrative disease, or myocarditis.  Holter monitor 09/29/2018 personally reviewed Minimum HR: 47 BPM at 2:42:56 AM Maximum HR: 106 BPM at 9:57:24 AM Average HR: 71 BPM 5.8% PVCs <0.01% PACs Zero atrial fibrillation Sinus rhythm with sinus tachycardia  ASSESSMENT AND PLAN:  1.  Nonischemic cardiomyopathy: Ejection fraction 40 to 45% on optimal medical therapy.  Via signs of volume overload.  No changes.  2. Obstructive sleep apnea: CPAP compliance encouraged  3. Nonobstructive coronary artery disease: Continue with current management.  No chest pain.  4. PVCs: Unfortunately not a candidate for Rythmol or flecainide due to coronary disease.  Did not tolerate mexiletine.  Has been on amiodarone  in the past but this has since been stopped.  He is having 5% PVCs on his cardiac monitor.  No changes.  5.  Hypertension: Elevated today but has been normal more normal in the past.  He feels that it is elevated due to an upper respiratory illness.  6.  Hyperlipidemia: Continue Crestor   Current medicines are reviewed at length with the patient today.   The patient does not have concerns regarding his medicines.  The following changes were made today: None  Labs/ tests ordered today include:  Orders Placed This Encounter  Procedures  . EKG 12-Lead     Disposition:   FU with Heston Widener 6 month  Signed, Jaymien Landin Meredith Leeds, MD  09/29/2018 11:16 AM     [facility] [address] [place] [address] [phone] (office) [phone] (fax)

## 2019-06-17 ENCOUNTER — Other Ambulatory Visit: Payer: Self-pay | Admitting: Cardiology

## 2019-06-18 ENCOUNTER — Other Ambulatory Visit: Payer: Self-pay | Admitting: Cardiology

## 2019-06-18 MED ORDER — CARVEDILOL 6.25 MG PO TABS: 6.2500 mg | ORAL_TABLET | Freq: Two times a day (BID) | ORAL | 0 refills | Status: AC
